# Patient Record
Sex: Male | Born: 1960 | Race: White | Hispanic: No | Marital: Single | Smoking: Current every day smoker
Health system: Southern US, Community
[De-identification: ages and names within clinical notes are randomized; demographics above are authoritative.]

## PROBLEM LIST (undated history)

## (undated) DIAGNOSIS — F419 Anxiety disorder, unspecified: Secondary | ICD-10-CM

## (undated) DIAGNOSIS — K219 Gastro-esophageal reflux disease without esophagitis: Secondary | ICD-10-CM

## (undated) DIAGNOSIS — Z9889 Other specified postprocedural states: Secondary | ICD-10-CM

## (undated) DIAGNOSIS — E785 Hyperlipidemia, unspecified: Secondary | ICD-10-CM

## (undated) DIAGNOSIS — M1990 Unspecified osteoarthritis, unspecified site: Secondary | ICD-10-CM

## (undated) HISTORY — PX: KNEE SURGERY: SHX244

## (undated) HISTORY — DX: Hyperlipidemia, unspecified: E78.5

## (undated) HISTORY — DX: Anxiety disorder, unspecified: F41.9

## (undated) HISTORY — DX: Other specified postprocedural states: Z98.890

## (undated) HISTORY — DX: Gastro-esophageal reflux disease without esophagitis: K21.9

## (undated) HISTORY — DX: Unspecified osteoarthritis, unspecified site: M19.90

---

## 2006-12-11 ENCOUNTER — Emergency Department: Payer: Self-pay | Admitting: Emergency Medicine

## 2007-06-27 ENCOUNTER — Ambulatory Visit: Payer: Self-pay | Admitting: Gastroenterology

## 2009-03-03 ENCOUNTER — Ambulatory Visit: Payer: Self-pay | Admitting: Gastroenterology

## 2009-05-22 ENCOUNTER — Emergency Department: Payer: Self-pay | Admitting: Emergency Medicine

## 2011-05-29 LAB — CBC
HCT: 46.7 % (ref 40.0–52.0)
MCHC: 33.1 g/dL (ref 32.0–36.0)
MCV: 96 fL (ref 80–100)
RBC: 4.87 10*6/uL (ref 4.40–5.90)
RDW: 13 % (ref 11.5–14.5)

## 2011-05-29 LAB — TROPONIN I: Troponin-I: <0.02 ng/mL

## 2011-05-29 LAB — BASIC METABOLIC PANEL
Anion Gap: 11 (ref 7–16)
Chloride: 105 mmol/L (ref 98–107)
Co2: 23 mmol/L (ref 21–32)
Creatinine: 0.99 mg/dL (ref 0.60–1.30)
EGFR (African American): >60
EGFR (Non-African Amer.): >60
Osmolality: 278 (ref 275–301)
Sodium: 139 mmol/L (ref 136–145)

## 2011-05-29 LAB — CK TOTAL AND CKMB (NOT AT ARMC): CK-MB: <0.5 ng/mL — ABNORMAL LOW (ref 0.5–3.6)

## 2011-05-30 ENCOUNTER — Observation Stay: Payer: Self-pay | Admitting: Internal Medicine

## 2011-05-30 DIAGNOSIS — R079 Chest pain, unspecified: Secondary | ICD-10-CM

## 2011-05-30 LAB — CK TOTAL AND CKMB (NOT AT ARMC)
CK, Total: 37 U/L (ref 35–232)
CK-MB: <0.5 ng/mL — ABNORMAL LOW (ref 0.5–3.6)
CK-MB: <0.5 ng/mL — ABNORMAL LOW (ref 0.5–3.6)

## 2011-05-30 LAB — TROPONIN I
Troponin-I: <0.02 ng/mL
Troponin-I: <0.02 ng/mL

## 2011-07-13 ENCOUNTER — Emergency Department: Payer: Self-pay | Admitting: Emergency Medicine

## 2011-07-13 LAB — CBC
HCT: 45.3 % (ref 40.0–52.0)
HGB: 14.7 g/dL (ref 13.0–18.0)
Platelet: 255 10*3/uL (ref 150–440)
RBC: 4.71 10*6/uL (ref 4.40–5.90)

## 2011-07-13 LAB — BASIC METABOLIC PANEL
Anion Gap: 11 (ref 7–16)
BUN: 9 mg/dL (ref 7–18)
Calcium, Total: 9 mg/dL (ref 8.5–10.1)
Chloride: 107 mmol/L (ref 98–107)
EGFR (African American): >60
EGFR (Non-African Amer.): >60
Glucose: 91 mg/dL (ref 65–99)
Osmolality: 281 (ref 275–301)

## 2011-07-13 LAB — CK TOTAL AND CKMB (NOT AT ARMC)
CK, Total: 61 U/L (ref 35–232)
CK-MB: <0.5 ng/mL — ABNORMAL LOW (ref 0.5–3.6)

## 2011-07-13 LAB — TROPONIN I: Troponin-I: <0.02 ng/mL

## 2011-09-19 ENCOUNTER — Ambulatory Visit: Payer: Self-pay

## 2012-03-13 ENCOUNTER — Ambulatory Visit: Payer: Self-pay | Admitting: Gastroenterology

## 2012-04-04 ENCOUNTER — Ambulatory Visit: Payer: Self-pay | Admitting: Internal Medicine

## 2012-06-19 ENCOUNTER — Emergency Department: Payer: Self-pay | Admitting: Emergency Medicine

## 2012-06-20 ENCOUNTER — Ambulatory Visit: Payer: Self-pay | Admitting: Gastroenterology

## 2013-05-23 IMAGING — CT CT ABD-PELV W/ CM
1 of 3 series · 13 of 32 positions shown, 18 images · non-contrast
Comparison: none

REASON FOR EXAM: Abd pain generalized and lower quadrants
COMMENTS:

[Series 2: abd 3mm w 3.0 i40f 3 · axial · 0.92mm/px · z∈[-1178,-770]mm · 13 of 154 slices shown, 18 images]
[im 9/154  soft-tissue]
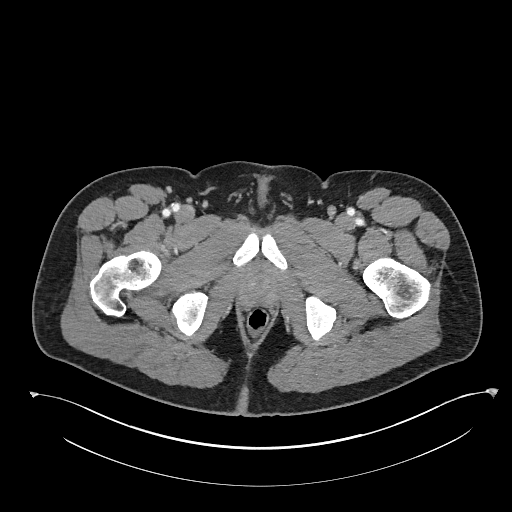
[im 9/154  bone]
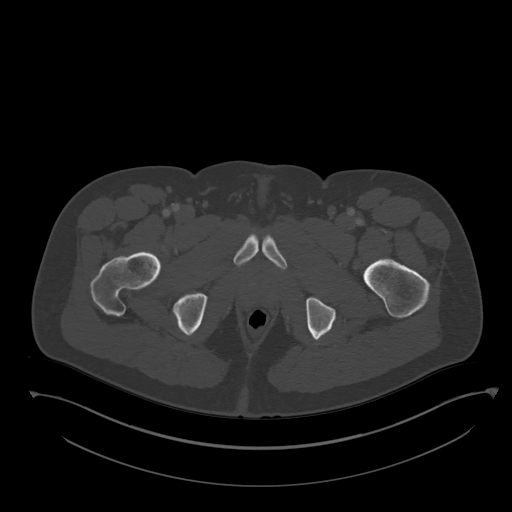
[im 26/154  soft-tissue]
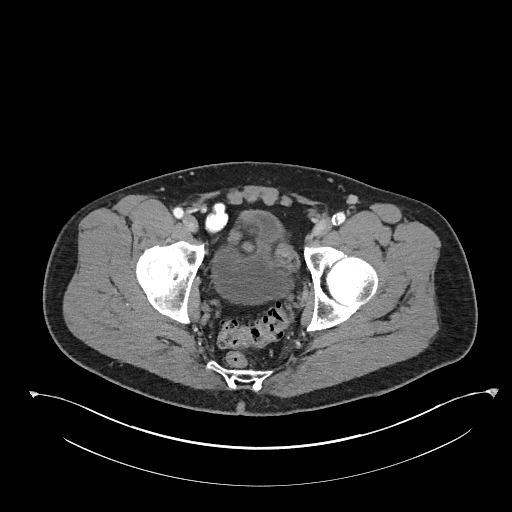
[im 35/154  soft-tissue]
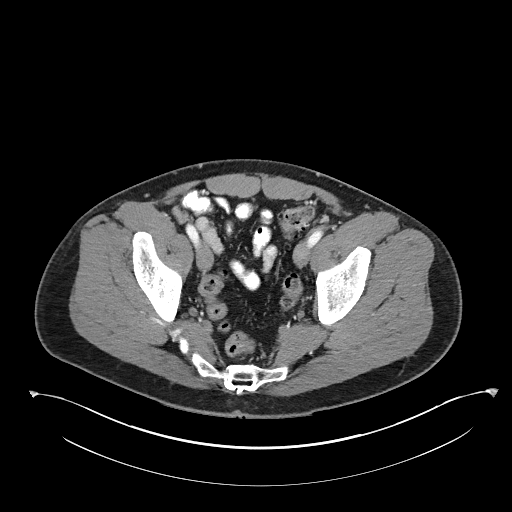
[im 43/154  soft-tissue]
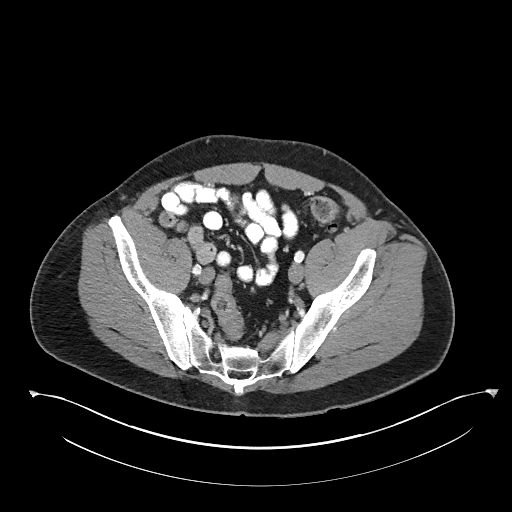
[im 60/154  soft-tissue]
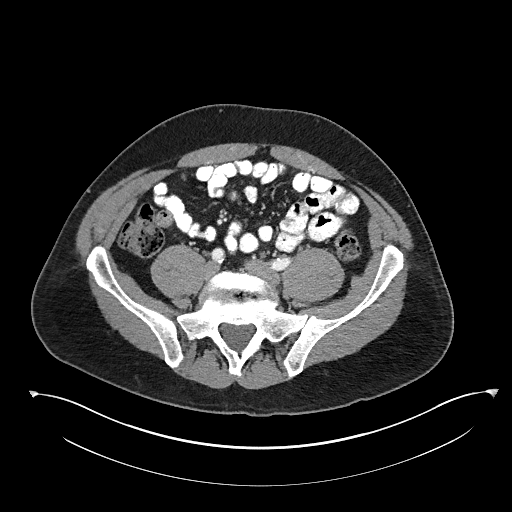
[im 69/154  soft-tissue]
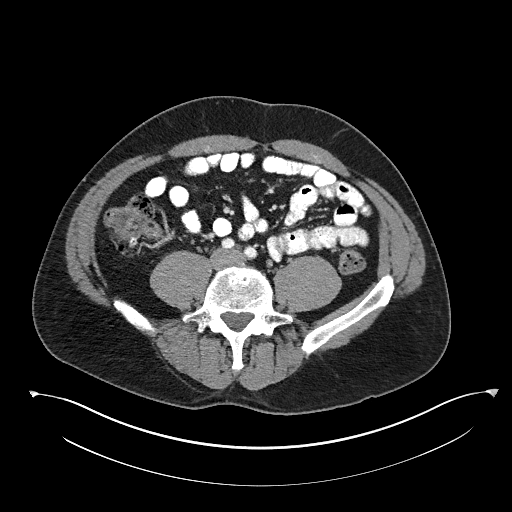
[im 86/154  soft-tissue]
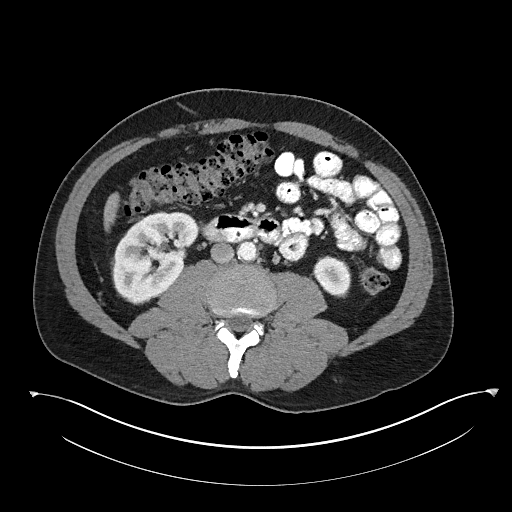
[im 94/154  soft-tissue]
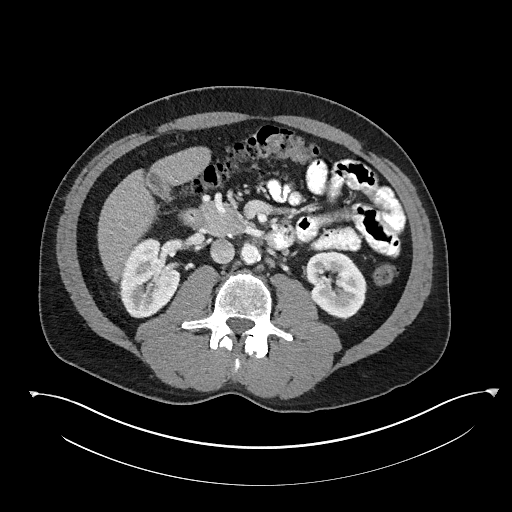
[im 111/154  soft-tissue]
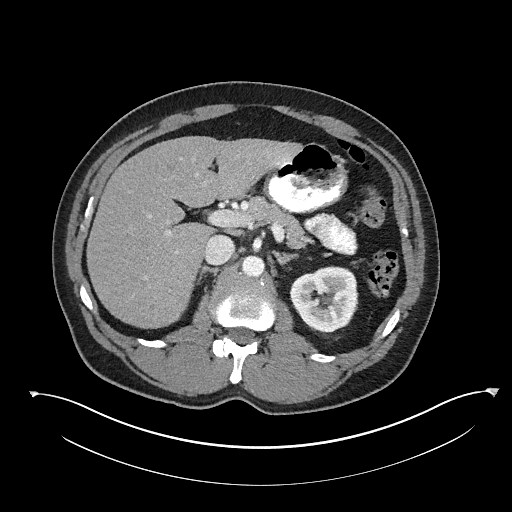
[im 111/154  bone]
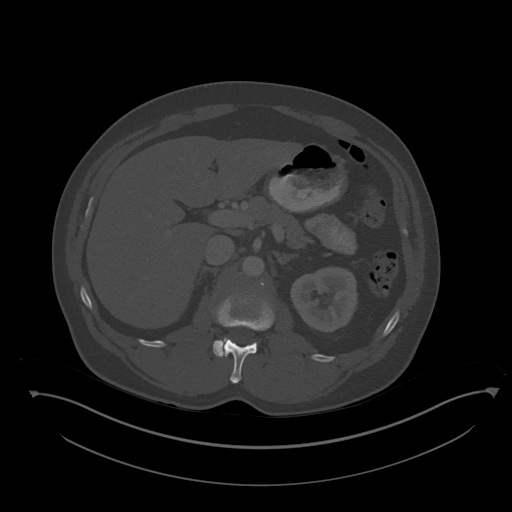
[im 120/154  soft-tissue]
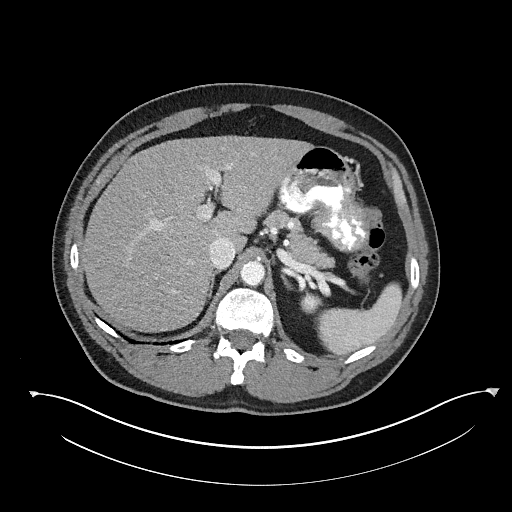
[im 120/154  lung]
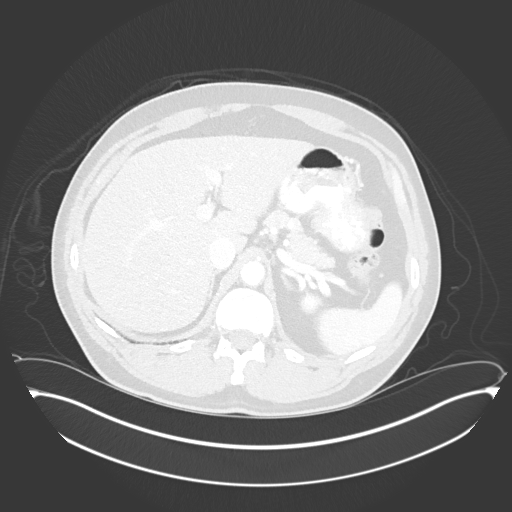
[im 128/154  soft-tissue]
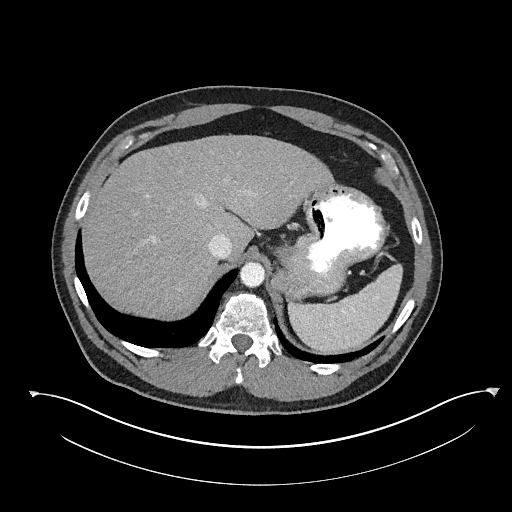
[im 128/154  lung]
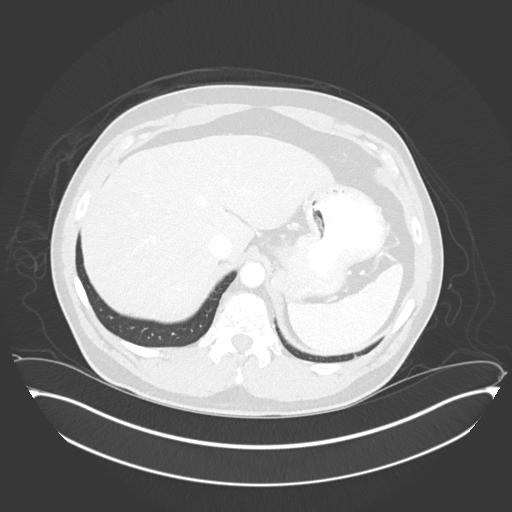
[im 137/154  lung]
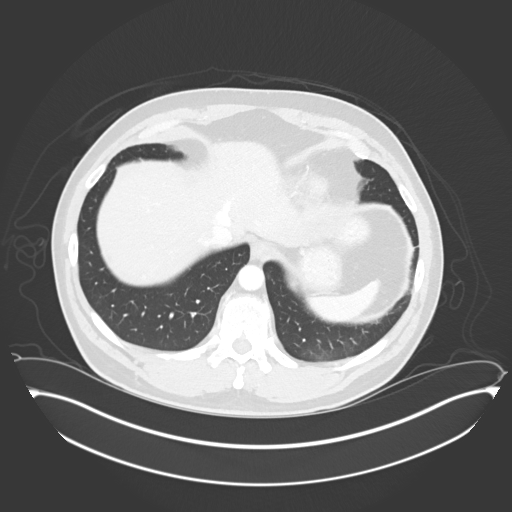
[im 145/154  soft-tissue]
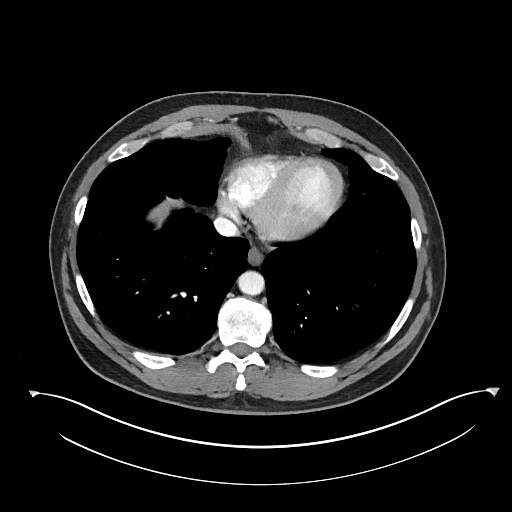
[im 145/154  lung]
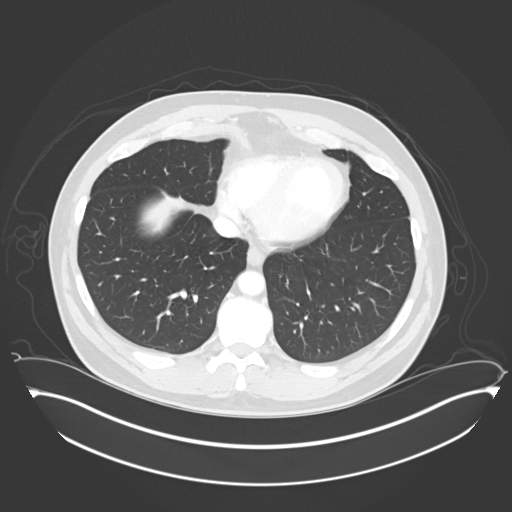

[13 of 32 positions shown; findings below may reference images not displayed]

PROCEDURE:     KCT - KCT ABDOMEN/PELVIS W  - March 13, 2012  [DATE]

RESULT:     CT of the abdomen and pelvis is performed with 100 mL of
2sovue-F55 iodinated intravenous contrast and oral contrast with images
reconstructed at 3 mm slice thickness in the axial plane. Comparison is made
to the previous study dated 27 June, 2007.

Images through the base the lungs demonstrate normal appearing aeration.
There is no pleural or pericardial effusion or pneumothorax. The liver
appears to enhance homogeneously. Punctate calcification in the spleen
suggests previous granulomatous disease. There is a small accessory splenule
on image 39 measuring approximately 12 mm. The adrenal glands appear normal.
The abdominal aorta show some atherosclerotic irregularity and calcification
without aneurysm. There is no evidence of hydronephrosis or nephrolithiasis
demonstrated. No perinephric stranding or abnormal perinephric fluid
collection is evident. The visualized pancreas shows no discrete mass or
ductal dilation. The liver contains a small low-attenuation area in the
right lobe the previously currently measuring approximately 10.7 mm on image
42 and previously measuring 5.2 mm on image 22. The gallbladder is
nondistended and shows no definite stones. There is no abnormal small bowel
distention or wall thickening. The urinary bladder contains a small to
moderate amount of urine without evidence of abnormal distention. No
definite wall thickening is evident. There is no inguinal hernia or evidence
of inguinal mass or adenopathy. The appendix appears normal. There is no
ascites or abnormal fluid collection. There is no inflammatory stranding.
Scattered colonic diverticulosis is present to a mild to moderate degree
predominately in the sigmoid and descending colon regions. The bony
structures appear unremarkable.
IMPRESSION: 1. Small right lobe hepatic cyst slightly larger than the previous study.
Scattered colonic diverticulosis without evidence of acute diverticulitis.
Normal-appearing appendix. No ascites or phlegmonous change. No adenopathy
or acute abnormality. Scattered atherosclerotic disease. The lung bases are
clear.

[REDACTED]

## 2013-08-03 ENCOUNTER — Emergency Department: Payer: Self-pay | Admitting: Emergency Medicine

## 2013-08-03 LAB — CBC
HCT: 48.6 % (ref 40.0–52.0)
HGB: 16.4 g/dL (ref 13.0–18.0)
MCH: 32 pg (ref 26.0–34.0)
MCHC: 33.6 g/dL (ref 32.0–36.0)
MCV: 95 fL (ref 80–100)
Platelet: 253 10*3/uL (ref 150–440)
RBC: 5.1 10*6/uL (ref 4.40–5.90)
RDW: 13.4 % (ref 11.5–14.5)
WBC: 12.4 10*3/uL — ABNORMAL HIGH (ref 3.8–10.6)

## 2013-08-03 LAB — BASIC METABOLIC PANEL
ANION GAP: 8 (ref 7–16)
BUN: 11 mg/dL (ref 7–18)
CHLORIDE: 107 mmol/L (ref 98–107)
Calcium, Total: 8.9 mg/dL (ref 8.5–10.1)
Co2: 23 mmol/L (ref 21–32)
Creatinine: 0.99 mg/dL (ref 0.60–1.30)
Glucose: 101 mg/dL — ABNORMAL HIGH (ref 65–99)
OSMOLALITY: 275 (ref 275–301)
Potassium: 4.6 mmol/L (ref 3.5–5.1)
SODIUM: 138 mmol/L (ref 136–145)

## 2013-08-03 LAB — TROPONIN I: Troponin-I: <0.02 ng/mL

## 2013-12-28 ENCOUNTER — Emergency Department: Payer: Self-pay | Admitting: Emergency Medicine

## 2014-04-25 NOTE — Discharge Summary (Signed)
PATIENT NAME:  [name], Steven MR#:  [id number] DATE OF BIRTH:  04-04-1960  DATE OF ADMISSION:  05/30/2011 DATE OF DISCHARGE:  05/30/2011  ADMISSION DIAGNOSIS: Chest pain.   DISCHARGE DIAGNOSES:  1. Chest pain, not cardiac in nature.  2. Elevated blood pressure, no diagnosis of hypertension.  3. Tobacco abuse.   CONSULTATIONS: None.   LABORATORY, DIAGNOSTIC, AND RADIOLOGICAL DATA: CT of the chest showed no pulmonary emboli.   Nuclear Medicine stress test showed no evidence of ischemia.  Troponins x2 were negative.   HOSPITAL COURSE: The patient is a 54 year old male who presented with chest pain. For further details, please refer to the history and physical.   1. Chest pain. The patient had a normal D-dimer. CT chest was performed to rule out PE which was negative. He went for a Myoview which also was negative.  2. Elevated blood pressure, no diagnosis of hypertension. The patient will be discharged on metoprolol and the patient will have outpatient follow-up for this.  3. Tobacco abuse. The patient was counseled and was discharged with a nicotine patch.   DISCHARGE MEDICATIONS:  1. Aspirin 81 mg daily. 2. Metoprolol 25 b.i.d.  3. Nicotine patch 21 mg per 24 hours.   DISCHARGE DIET: Low sodium.   DISCHARGE ACTIVITY: As tolerated.   DISCHARGE FOLLOW-UP: The patient will need to find a primary care physician in the community for follow-up.   DISPOSITION: The patient was medically stable for discharge.   TIME SPENT: 35 minutes.  ____________________________ Janyth ContesSital P. Juliene PinaMody, MD spm:drc D: 05/31/2011 13:18:41 ET T: 06/01/2011 09:31:12 ET JOB#: [id number]  cc: Mehek Grega P. Juliene PinaMody, MD, <Dictator> Janyth ContesSITAL P Yohann Curl MD ELECTRONICALLY SIGNED 06/08/2011 12:40

## 2014-04-25 NOTE — H&P (Signed)
PATIENT NAME:  Steven Escobar, SECKMAN MR#:  [id number] DATE OF BIRTH:  [birth year]  DATE OF ADMISSION:  05/30/2011  PRIMARY CARE PHYSICIAN: Unassigned.   SUBJECTIVE: This is a 54 year old male who presents to the ER with a chief complaint of chest pain which he has been experiencing for the last one month. The chest pain is mainly pleuritic without any aggravating or relieving factors. He does not notice a noticeable change with exertion. It is associated with shortness of breath, made worse when the patient lays flat in bed. The patient continues to smoke a pack a day. He, however, denies any ongoing drug use. The patient has not had any fever, chills, rigors or cough. He denies any syncopal or near syncopal episodes. He has not had any previous cardiac work-up. He states that last year a homeopathic physician diagnosed him with pancreatic cancer; however, this was not based on any imaging studies, and he has not had a follow-up for this since.   PAST MEDICAL HISTORY:  1. Nicotine dependence.  2. Chronic back pain.   PAST SURGICAL HISTORY: None.   SOCIAL HISTORY: The patient is unemployed. He has been unemployed for the last two years. 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 He states that he quit drinking three years ago and is clean and has no history of drug use.   FAMILY HISTORY: Positive for colon cancer in the father. Otherwise, negative for heart disease, hypertension, and diabetes.   ALLERGIES: No known drug allergies.   HOME MEDICATIONS: None.   REVIEW OF SYSTEMS: CONSTITUTIONAL: Denies any fever, fatigue, weakness, pain, weight loss or weight gain. EYES: Denies any blurry vision or double vision, inflammation, glaucoma or cataracts. ENT: Denies any tinnitus, ear pain, hearing loss, seasonal allergies, epistaxis. RESPIRATORY: Denies any cough, wheezing, hemoptysis or dyspnea. CARDIOVASCULAR: Denies any edema but does have chest pain and some orthopnea and  dyspnea on exertion. GASTROINTESTINAL: Denies any nausea, vomiting, abdominal pain, hematemesis, melena. GENITOURINARY: Denies any dysuria, hematuria, renal calculi, or frequency. ENDOCRINE: Denies any polyuria, nocturia, thyroid problems, increased swelling. HEMATOLOGIC:  Denies any easy bruising, bleeding, swollen glands. SKIN: Denies any rashes, changes in moles, hair or skin. MUSCULOSKELETAL: Complaining of low back pain which is chronic for him, otherwise denies any joint involvement, any redness or any limitation of activity. NEUROLOGIC: Denies any numbness, weakness, dysarthria, epilepsy. PSYCHIATRIC: Appears to be mildly anxious, otherwise appropriate.   PHYSICAL EXAMINATION:  VITAL SIGNS: Blood pressure 133/90, pulse 95, temperature 97.   GENERAL: Appears to be comfortable currently, no acute cardiopulmonary distress.   HEENT: Pupils are equal and reactive to light. Extraocular movements are intact.   NECK: Supple without any JVD. No thyroid enlargement, no meningismus.   LUNGS: Clear to auscultation bilaterally. No wheezes or crackles, rhonchi. No accessory muscle use.   CARDIOVASCULAR: Regular rate and rhythm. No murmurs, rubs, or gallops.   ABDOMEN: Soft, nontender, nondistended. Bowel sounds are normoactive.   NEUROLOGIC: Cranial nerves II through XII are grossly intact. Motor strength is intact in bilateral upper and lower extremities. No cerebellar deficits. Gait not tested.   PSYCHIATRIC: Appropriate mood and affect.   LYMPHATIC: No axillary, inguinal, cervical lymphadenopathy.   SKIN: Without any skin rashes, redness.   PSYCHIATRIC: Alert and oriented x3, appropriate mood and affect.     LABORATORY, DIAGNOSTIC AND RADIOLOGICAL DATA:  WBC 9.3, hemoglobin 15.5, hematocrit 46.7, platelet count 265. Glucose 113, BUN 11, creatinine 0.99, sodium 139, potassium 3.9,  chloride 105, bicarbonate 23, serum calcium 8.7, anion gap 11.  Troponin less than 0.02. CK-MB less than 0.5.    ASSESSMENT AND PLAN:  1. Chest pain with pleuritic features as well as features of unstable angina: At this point, the patient and has no documented clear past medical history, which also includes questionable history of pancreatic cancer. A D-dimer will be obtained; if elevated, a CT angiogram of the chest will be done to rule out a pulmonary embolism. We will start him on a therapeutic dose of Lovenox as well as aspirin, beta blocker and nitroglycerin. We will cycle cardiac enzymes. A Lexiscan Myoview will be scheduled for in the morning. He will be kept n.p.o. for this.  2. Nicotine dependence: At this time, hold off on the nicotine patch. This may be restarted in the morning if his two sets of cardiac enzymes are negative.  3. Family history of colon cancer: The patient had a colonoscopy with removal of polyp fairly recently and has a history of diverticulosis which has been asymptomatic.  4. Questionable history of pancreatic cancer: We will order an abdominal ultrasound to ensure that there are no gross abnormal findings. We will also add a lipase and liver function tests.   CODE STATUS:  He is a FULL CODE.     ____________________________ Richarda OverlieNayana Wylene Weissman, MD na:cbb D: 05/30/2011 00:32:39 ET T: 05/30/2011 07:18:55 ET JOB#: [id number]  cc: Richarda OverlieNayana Kamla Skilton, MD, <Dictator> Richarda OverlieNAYANA Brailyn Delman MD ELECTRONICALLY SIGNED 06/13/2011 0:13

## 2016-03-30 ENCOUNTER — Telehealth: Payer: Self-pay | Admitting: Pharmacist

## 2016-03-30 NOTE — Telephone Encounter (Signed)
03/09/16 Received a call from Tabitha @ [facility] stating Dr. Ricki Rodriguez Declined to sign form for Dexilant, patient has canceled last 2 appointment 08/23/15 & 10/04/15. I have picked up the forms from their office with a note on this documentation, filing in patient chart. I will close this claim and send a note to Valinda and also copy to [facility].

## 2016-05-10 ENCOUNTER — Telehealth: Payer: Self-pay | Admitting: Pharmacy Technician

## 2016-05-10 NOTE — Telephone Encounter (Signed)
Spoke with patient.  Patient stated that he does not funds to see Dr. Jacqulyn Liner at [facility] or Dr. Clayborn Bigness.  Referring patient to [facility].    Patient approved to receive medication assistance at [facility] through 2018, as long as eligibility continues to be met.  [facility]

## 2016-06-18 ENCOUNTER — Telehealth: Payer: Self-pay | Admitting: Pharmacist

## 2016-06-18 NOTE — Telephone Encounter (Signed)
06/18/16 Faxed Re Enrollment application to [employer] for Dexilant 60mg  Take one tablet daily.

## 2016-08-02 DIAGNOSIS — M1710 Unilateral primary osteoarthritis, unspecified knee: Secondary | ICD-10-CM | POA: Insufficient documentation

## 2016-08-02 DIAGNOSIS — M179 Osteoarthritis of knee, unspecified: Secondary | ICD-10-CM | POA: Insufficient documentation

## 2016-08-23 ENCOUNTER — Telehealth: Payer: Self-pay | Admitting: Pharmacist

## 2016-08-23 NOTE — Telephone Encounter (Signed)
08/23/16 Called [employer] for refill on Dexilant 60mg .Forde Radon

## 2016-11-02 ENCOUNTER — Telehealth: Payer: Self-pay | Admitting: Pharmacist

## 2016-11-02 NOTE — Telephone Encounter (Signed)
11/02/16 Called [employer] - placed refill for Dexilant 60mg .Forde RadonAJ

## 2017-01-30 ENCOUNTER — Telehealth: Payer: Self-pay | Admitting: Pharmacist

## 2017-01-30 NOTE — Telephone Encounter (Signed)
01/30/17 Called [employer] for refill on Dexilant 60, allow 7-10 business days to receive.Steven RadonAJ

## 2017-02-13 ENCOUNTER — Telehealth: Payer: Self-pay | Admitting: Pharmacy Technician

## 2017-02-13 NOTE — Telephone Encounter (Signed)
Patient eligible to receive medication assistance at [facility] through 2019, as long as eligibility requirements continue to be met.  [facility]

## 2017-03-19 ENCOUNTER — Telehealth: Payer: Self-pay | Admitting: Pharmacist

## 2017-03-19 NOTE — Telephone Encounter (Signed)
03/19/2017 2:32:34 PM - Dexilant  03/19/17 Called [employer] to check on status of refill for Dexilant 60mg  that I called a refill 01/30/17 and we have not received. Spoke with Alcario Droughtrica she stated that patient's enrollment ended 12/31/16, they will need a new application before further shipments. I have printed a new application--mailing patient his portion to sign & return, will take provider her portion to sign.Forde RadonAJ

## 2017-04-01 ENCOUNTER — Ambulatory Visit: Payer: Self-pay | Admitting: Physician Assistant

## 2017-04-01 ENCOUNTER — Encounter: Payer: Self-pay | Admitting: Physician Assistant

## 2017-04-01 ENCOUNTER — Other Ambulatory Visit: Payer: Self-pay

## 2017-04-01 VITALS — BP 142/100 | HR 82 | Temp 97.8°F | Resp 16 | Ht 74.0 in | Wt 263.0 lb

## 2017-04-01 DIAGNOSIS — Z7689 Persons encountering health services in other specified circumstances: Secondary | ICD-10-CM

## 2017-04-01 DIAGNOSIS — K2100 Gastro-esophageal reflux disease with esophagitis, without bleeding: Secondary | ICD-10-CM

## 2017-04-01 DIAGNOSIS — F17200 Nicotine dependence, unspecified, uncomplicated: Secondary | ICD-10-CM

## 2017-04-01 DIAGNOSIS — E755 Other lipid storage disorders: Secondary | ICD-10-CM

## 2017-04-01 DIAGNOSIS — I10 Essential (primary) hypertension: Secondary | ICD-10-CM | POA: Insufficient documentation

## 2017-04-01 DIAGNOSIS — M1711 Unilateral primary osteoarthritis, right knee: Secondary | ICD-10-CM

## 2017-04-01 DIAGNOSIS — F1011 Alcohol abuse, in remission: Secondary | ICD-10-CM | POA: Insufficient documentation

## 2017-04-01 DIAGNOSIS — E782 Mixed hyperlipidemia: Secondary | ICD-10-CM | POA: Insufficient documentation

## 2017-04-01 DIAGNOSIS — Z87898 Personal history of other specified conditions: Secondary | ICD-10-CM

## 2017-04-01 DIAGNOSIS — K219 Gastro-esophageal reflux disease without esophagitis: Secondary | ICD-10-CM | POA: Insufficient documentation

## 2017-04-01 NOTE — Patient Instructions (Signed)

## 2017-04-01 NOTE — Progress Notes (Signed)
Patient: Steven Escobar, Male    DOB: Sep 01, 1960, 57 y.o.   MRN: [id number] Visit Date: 04/01/2017  Today's Provider: Margaretann Loveless, PA-C   Chief Complaint  Patient presents with  . New Patient (Initial Visit)   Subjective:   Patient here today to establish care, patient reports he has not had a PCP in several years. Patient requesting lab checked.   Steven Escobar is a 57 y.o. male who presents today for establishing care. He feels well. He reports exercising none. He reports he is sleeping well.  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 He was told it was from cholesterol and wants his cholesterol levels checked.   He does have HTN and is followed by Dr. Juliann Pares. He is currently on Metoprolol 50mg  and Lisinopril 10mg . He also has reflux esophagitis and is well controlled on Dexilant 60mg . He is to have his colonoscopy this year if he can afford it. He is scheduled for f/u with GI on 06/04/17.   He is currently unemployed due to severe OA in his right knee, followed by [facility] ortho.  -----------------------------------------------------------------   Review of Systems  Constitutional: Negative.   HENT: Negative.   Eyes: Negative.   Respiratory: Negative.   Cardiovascular: Negative.   Gastrointestinal: Negative.   Endocrine: Negative.   Genitourinary: Negative.   Musculoskeletal: Positive for arthralgias (knee).  Skin: Negative.   Allergic/Immunologic: Negative.   Neurological: Negative.   Hematological: Negative.   Psychiatric/Behavioral: The patient is nervous/anxious.     Social History      He  reports that he has been smoking cigarettes.  He has a 20.00 pack-year smoking history. He has never used smokeless tobacco. He reports that he drinks about 1.8 oz of alcohol per week. He reports that he does not use drugs.       Social History   Socioeconomic History  . Marital status: Single    Spouse name:  Not on file  . Number of children: 1  . Years of education: Not on file  . Highest education level: Not on file  Occupational History  . Not on file  Social Needs  . Financial resource strain: Somewhat hard  . Food insecurity:    Worry: Sometimes true    Inability: Not on file  . Transportation needs:    Medical: No    Non-medical: No  Tobacco Use  . Smoking status: Current Every Day Smoker    Packs/day: 0.50    Years: 40.00    Pack years: 20.00    Types: Cigarettes  . Smokeless tobacco: Never Used  Substance and Sexual Activity  . Alcohol use: Yes    Alcohol/week: 1.8 oz    Types: 3 Cans of beer per week  . Drug use: Never  . Sexual activity: Not on file  Lifestyle  . Physical activity:    Days per week: 0 days    Minutes per session: 0 min  . Stress: Not on file  Relationships  . Social connections:    Talks on phone: Not on file    Gets together: Not on file    Attends religious service: Not on file    Active member of club or organization: Not on file    Attends meetings of clubs or organizations: Not on file    Relationship status: Not on file  Other Topics Concern  . Not on file  Social History Narrative  .  Not on file    Past Medical History:  Diagnosis Date  . Anxiety   . Arthritis   . GERD (gastroesophageal reflux disease)   . H/O knee surgery    bilateral   . Hyperlipidemia      Patient Active Problem List   Diagnosis Date Noted  . GERD (gastroesophageal reflux disease) 04/01/2017  . Hypertension 04/01/2017  . Osteoarthritis of knee 08/02/2016    Past Surgical History:  Procedure Laterality Date  . KNEE SURGERY Bilateral     Family History        Family Status  Relation Name Status  . Father  Deceased  . Mother  Alive  . Sister  Alive  . Brother  Alive  . Daughter  Alive  . Sister  Alive        His family history includes Colon cancer in his father; Melanoma in his sister.      No Known Allergies   Current Outpatient  Medications:  .  dexlansoprazole (DEXILANT) 60 MG capsule, Take 60 mg by mouth daily. , Disp: , Rfl:  .  lisinopril (PRINIVIL,ZESTRIL) 10 MG tablet, TAKE ONE TABLET BY MOUTH EVERY DAY, Disp: , Rfl:  .  metoprolol tartrate (LOPRESSOR) 50 MG tablet, TAKE ONE TABLET BY MOUTH 2 TIMES A DAY, Disp: , Rfl:    Patient Care Team: Margaretann Loveless, PA-C as PCP - General (Family Medicine)      Objective:   Vitals: BP (!) 142/100 (BP Location: Left Arm, Patient Position: Sitting, Cuff Size: Large)   Pulse 82   Temp 97.8 F (36.6 C) (Oral)   Resp 16   Ht 6\' 2"  (1.88 m)   Wt 263 lb (119.3 kg)   SpO2 97%   BMI 33.77 kg/m    Vitals:   04/01/17 1019  BP: (!) 142/100  Pulse: 82  Resp: 16  Temp: 97.8 F (36.6 C)  TempSrc: Oral  SpO2: 97%  Weight: 263 lb (119.3 kg)  Height: 6\' 2"  (1.88 m)     Physical Exam  Constitutional: He is oriented to person, place, and time. He appears well-developed and well-nourished.  HENT:  Head: Normocephalic and atraumatic.  Right Ear: Hearing, tympanic membrane, external ear and ear canal normal.  Left Ear: Hearing, tympanic membrane, external ear and ear canal normal.  Nose: Nose normal.  Mouth/Throat: Uvula is midline, oropharynx is clear and moist and mucous membranes are normal.  Eyes: Pupils are equal, round, and reactive to light. Conjunctivae and EOM are normal.    Neck: Normal range of motion. Neck supple. Carotid bruit is not present. No tracheal deviation present. No thyromegaly present.  Cardiovascular: Normal rate, regular rhythm, normal heart sounds and intact distal pulses.  No murmur heard. Pulmonary/Chest: Effort normal and breath sounds normal. No respiratory distress. He has no wheezes. He has no rales. He exhibits no tenderness.  Abdominal: Soft. He exhibits no distension and no mass. There is no tenderness. There is no rebound and no guarding.  Musculoskeletal: Normal range of motion. He exhibits no edema or tenderness.    Lymphadenopathy:    He has no cervical adenopathy.  Neurological: He is alert and oriented to person, place, and time. He has normal reflexes. No cranial nerve deficit. He exhibits normal muscle tone. Coordination normal.  Skin: Skin is warm and dry. No rash noted. No erythema.  Psychiatric: He has a normal mood and affect. His behavior is normal. Judgment and thought content normal.  Vitals reviewed.  Depression Screen PHQ 2/9 Scores 04/01/2017  PHQ - 2 Score 3  PHQ- 9 Score 4      Assessment & Plan:     Routine Health Maintenance and Physical Exam  Exercise Activities and Dietary recommendations Goals    None       There is no immunization history on file for this patient.  Health Maintenance  Topic Date Due  . Hepatitis C Screening  1960/02/24  . HIV Screening  05/25/1975  . TETANUS/TDAP  05/25/1979  . COLONOSCOPY  05/25/2010  . INFLUENZA VACCINE  08/01/2017     Discussed health benefits of physical activity, and encouraged him to engage in regular exercise appropriate for his age and condition.    1. Encounter to establish care No previous PCP.   2. Essential hypertension Uncontrolled today but patient reports not taking his medications this morning, stating he is out and plans to pick up his Rx from medication management. I will check labs as below for the xanthoma noted on the left eyelid. I will f/u pending results of the labs.  - Lipid Panel With LDL/HDL Ratio - Comprehensive metabolic panel  3. Xanthoma See above medical treatment plan. - Lipid Panel With LDL/HDL Ratio  4. Gastroesophageal reflux disease with esophagitis Stable on Dexilant and followed by [facility].   5. Primary osteoarthritis of right knee Recently had steroid injection that did not offer much relief. Unable to afford Synvisc injections or any surgical procedures at this time. [facility] if [facility] may assist for him to get surgery.   6.  History of alcohol abuse Reports no longer drinking.  7. Compulsive tobacco user syndrome Wants to quit smoking but unable to afford medications or patches to help him quit.   --------------------------------------------------------------------    Margaretann LovelessJennifer M Burnette, PA-C  [facility]

## 2017-04-02 ENCOUNTER — Telehealth: Payer: Self-pay | Admitting: Pharmacist

## 2017-04-02 NOTE — Telephone Encounter (Signed)
04/02/2017 9:43:52 AM - Dexilant renewal to [employer]  04/02/17 Faxed [employer] a renewal application for Dexilant 60mg  Take one capsule daily.Forde RadonAJ

## 2017-04-03 ENCOUNTER — Other Ambulatory Visit: Payer: Self-pay

## 2017-04-03 LAB — COMPREHENSIVE METABOLIC PANEL
A/G RATIO: 1.4 (ref 1.2–2.2)
ALBUMIN: 4.2 g/dL (ref 3.5–5.5)
ALT: 46 IU/L — ABNORMAL HIGH (ref 0–44)
AST: 19 IU/L (ref 0–40)
Alkaline Phosphatase: 63 IU/L (ref 39–117)
BILIRUBIN TOTAL: 0.4 mg/dL (ref 0.0–1.2)
BUN/Creatinine Ratio: 11 (ref 9–20)
BUN: 12 mg/dL (ref 6–24)
CHLORIDE: 101 mmol/L (ref 96–106)
CO2: 25 mmol/L (ref 20–29)
Calcium: 10 mg/dL (ref 8.7–10.2)
Creatinine, Ser: 1.12 mg/dL (ref 0.76–1.27)
GFR calc non Af Amer: 73 mL/min/{1.73_m2} (ref >59)
GFR, EST AFRICAN AMERICAN: 84 mL/min/{1.73_m2} (ref >59)
Globulin, Total: 3.1 g/dL (ref 1.5–4.5)
Glucose: 98 mg/dL (ref 65–99)
POTASSIUM: 5.9 mmol/L — AB (ref 3.5–5.2)
Sodium: 141 mmol/L (ref 134–144)
TOTAL PROTEIN: 7.3 g/dL (ref 6.0–8.5)

## 2017-04-03 LAB — LIPID PANEL WITH LDL/HDL RATIO
Cholesterol, Total: 227 mg/dL — ABNORMAL HIGH (ref 100–199)
HDL: 38 mg/dL — AB (ref >39)
LDL Calculated: 148 mg/dL — ABNORMAL HIGH (ref 0–99)
LDl/HDL Ratio: 3.9 ratio — ABNORMAL HIGH (ref 0.0–3.6)
TRIGLYCERIDES: 206 mg/dL — AB (ref 0–149)
VLDL Cholesterol Cal: 41 mg/dL — ABNORMAL HIGH (ref 5–40)

## 2017-04-03 MED ORDER — ATORVASTATIN CALCIUM 20 MG PO TABS: 20.0000 mg | ORAL_TABLET | Freq: Every day | ORAL | 3 refills | Status: DC

## 2017-04-03 NOTE — Telephone Encounter (Signed)
Patient advised as below. Patient verbalizes understanding and is in agreement with treatment plan.  

## 2017-04-03 NOTE — Telephone Encounter (Signed)
-----   Message from Margaretann LovelessJennifer M Burnette, PA-C sent at 04/03/2017  8:36 AM EDT ----- Cholesterol is elevated. I would recommend starting a medication to lower cholesterol. This is generic and should not be expensive. Just let me know which pharmacy. Kidney and liver function are normal. Sugar is normal. Potassium is elevated. If taking any potassium supplements please discontinue. Sodium, chloride are normal.

## 2017-07-08 DIAGNOSIS — Z1211 Encounter for screening for malignant neoplasm of colon: Secondary | ICD-10-CM | POA: Diagnosis not present

## 2017-07-08 DIAGNOSIS — I10 Essential (primary) hypertension: Secondary | ICD-10-CM | POA: Diagnosis not present

## 2017-07-08 DIAGNOSIS — G8929 Other chronic pain: Secondary | ICD-10-CM | POA: Diagnosis not present

## 2017-07-08 DIAGNOSIS — M25561 Pain in right knee: Secondary | ICD-10-CM | POA: Diagnosis not present

## 2017-07-08 DIAGNOSIS — Z1159 Encounter for screening for other viral diseases: Secondary | ICD-10-CM | POA: Diagnosis not present

## 2017-07-12 DIAGNOSIS — Z23 Encounter for immunization: Secondary | ICD-10-CM | POA: Diagnosis not present

## 2017-07-12 DIAGNOSIS — I10 Essential (primary) hypertension: Secondary | ICD-10-CM | POA: Diagnosis not present

## 2017-07-12 DIAGNOSIS — Z01818 Encounter for other preprocedural examination: Secondary | ICD-10-CM | POA: Diagnosis not present

## 2017-08-07 DIAGNOSIS — D122 Benign neoplasm of ascending colon: Secondary | ICD-10-CM | POA: Diagnosis not present

## 2017-08-07 DIAGNOSIS — K5730 Diverticulosis of large intestine without perforation or abscess without bleeding: Secondary | ICD-10-CM | POA: Diagnosis not present

## 2017-08-07 DIAGNOSIS — K644 Residual hemorrhoidal skin tags: Secondary | ICD-10-CM | POA: Diagnosis not present

## 2017-08-07 DIAGNOSIS — D125 Benign neoplasm of sigmoid colon: Secondary | ICD-10-CM | POA: Diagnosis not present

## 2017-08-07 DIAGNOSIS — Z86010 Personal history of colonic polyps: Secondary | ICD-10-CM | POA: Diagnosis not present

## 2017-08-09 DIAGNOSIS — D369 Benign neoplasm, unspecified site: Secondary | ICD-10-CM | POA: Diagnosis not present

## 2017-08-20 DIAGNOSIS — S83241A Other tear of medial meniscus, current injury, right knee, initial encounter: Secondary | ICD-10-CM | POA: Diagnosis not present

## 2017-10-03 DIAGNOSIS — E875 Hyperkalemia: Secondary | ICD-10-CM | POA: Diagnosis not present

## 2017-10-03 DIAGNOSIS — Z131 Encounter for screening for diabetes mellitus: Secondary | ICD-10-CM | POA: Diagnosis not present

## 2017-10-03 DIAGNOSIS — E785 Hyperlipidemia, unspecified: Secondary | ICD-10-CM | POA: Diagnosis not present

## 2017-10-03 DIAGNOSIS — I10 Essential (primary) hypertension: Secondary | ICD-10-CM | POA: Diagnosis not present

## 2018-01-09 DIAGNOSIS — M25561 Pain in right knee: Secondary | ICD-10-CM | POA: Diagnosis not present

## 2018-01-27 DIAGNOSIS — E785 Hyperlipidemia, unspecified: Secondary | ICD-10-CM | POA: Diagnosis not present

## 2018-01-27 DIAGNOSIS — E875 Hyperkalemia: Secondary | ICD-10-CM | POA: Diagnosis not present

## 2018-01-27 DIAGNOSIS — S83241D Other tear of medial meniscus, current injury, right knee, subsequent encounter: Secondary | ICD-10-CM | POA: Diagnosis not present

## 2018-01-27 DIAGNOSIS — I10 Essential (primary) hypertension: Secondary | ICD-10-CM | POA: Diagnosis not present

## 2018-01-28 DIAGNOSIS — M25861 Other specified joint disorders, right knee: Secondary | ICD-10-CM | POA: Diagnosis not present

## 2018-01-28 DIAGNOSIS — Z9889 Other specified postprocedural states: Secondary | ICD-10-CM | POA: Diagnosis not present

## 2018-01-28 DIAGNOSIS — S83281D Other tear of lateral meniscus, current injury, right knee, subsequent encounter: Secondary | ICD-10-CM | POA: Diagnosis not present

## 2018-01-28 DIAGNOSIS — M949 Disorder of cartilage, unspecified: Secondary | ICD-10-CM | POA: Diagnosis not present

## 2018-02-10 DIAGNOSIS — M25561 Pain in right knee: Secondary | ICD-10-CM | POA: Diagnosis not present

## 2018-02-26 DIAGNOSIS — G8929 Other chronic pain: Secondary | ICD-10-CM | POA: Diagnosis not present

## 2018-02-26 DIAGNOSIS — M1711 Unilateral primary osteoarthritis, right knee: Secondary | ICD-10-CM | POA: Diagnosis not present

## 2018-03-11 DIAGNOSIS — L821 Other seborrheic keratosis: Secondary | ICD-10-CM | POA: Diagnosis not present

## 2018-03-11 DIAGNOSIS — Z1283 Encounter for screening for malignant neoplasm of skin: Secondary | ICD-10-CM | POA: Diagnosis not present

## 2018-03-11 DIAGNOSIS — H0260 Xanthelasma of unspecified eye, unspecified eyelid: Secondary | ICD-10-CM | POA: Diagnosis not present

## 2018-08-13 ENCOUNTER — Telehealth: Payer: Self-pay | Admitting: Pharmacy Technician

## 2018-08-13 NOTE — Telephone Encounter (Signed)
Patient failed to provide2020 financial documentation. No additional medication assistance will be provided by [facility] without the required proof of income documentation. Patient notified by letter.  Shanna Strength, CPhT [facility] 

## 2018-10-30 ENCOUNTER — Other Ambulatory Visit: Payer: Self-pay

## 2018-10-30 ENCOUNTER — Emergency Department
Admission: EM | Admit: 2018-10-30 | Discharge: 2018-10-30 | Disposition: A | Discharge status: Home / Self Care | Payer: Medicaid Other | Attending: Emergency Medicine | Admitting: Emergency Medicine

## 2018-10-30 ENCOUNTER — Emergency Department: Payer: Medicaid Other

## 2018-10-30 ENCOUNTER — Encounter: Payer: Self-pay | Admitting: Emergency Medicine

## 2018-10-30 DIAGNOSIS — I10 Essential (primary) hypertension: Secondary | ICD-10-CM | POA: Insufficient documentation

## 2018-10-30 DIAGNOSIS — F17210 Nicotine dependence, cigarettes, uncomplicated: Secondary | ICD-10-CM | POA: Diagnosis not present

## 2018-10-30 DIAGNOSIS — M545 Low back pain, unspecified: Secondary | ICD-10-CM

## 2018-10-30 DIAGNOSIS — Z79899 Other long term (current) drug therapy: Secondary | ICD-10-CM | POA: Insufficient documentation

## 2018-10-30 DIAGNOSIS — W19XXXA Unspecified fall, initial encounter: Secondary | ICD-10-CM

## 2018-10-30 DIAGNOSIS — W1789XA Other fall from one level to another, initial encounter: Secondary | ICD-10-CM | POA: Insufficient documentation

## 2018-10-30 MED ORDER — OXYCODONE HCL 5 MG PO TABS: 5.0000 mg | ORAL_TABLET | Freq: Four times a day (QID) | ORAL | 0 refills | Status: DC | PRN

## 2018-10-30 MED ORDER — METHOCARBAMOL 500 MG PO TABS: 500.0000 mg | ORAL_TABLET | Freq: Four times a day (QID) | ORAL | 0 refills | Status: DC | PRN

## 2018-10-30 NOTE — ED Triage Notes (Signed)
States he fell backwards  Landing on tailbone about 1 week ago  conts to have lower back pain  Ambulates slowly d/t pain

## 2018-10-30 NOTE — ED Provider Notes (Signed)
[facility], [facility] Emergency Department Provider Note   ____________________________________________   First MD Initiated Contact with Patient 10/30/18 [phone]     (approximate)  I have reviewed the triage vital signs and the nursing notes.   HISTORY  Chief Complaint Back Pain and Fall   HPI Les PouCarlton Matthias HughsWayne Seifried is a 58 y.o. male states that he fell from approximately 4 feet onto concrete landing on his lower back and tailbone.  Patient has continued to have low back pain since that time.  He has not taken any over-the-counter medications as he has GI issues.  Patient denies any paresthesias into his lower extremities.  He denies any head injury or loss of consciousness during his fall.  He rates pain as an 8 out of 10.       Past Medical History:  Diagnosis Date  . Anxiety   . Arthritis   . GERD (gastroesophageal reflux disease)   . H/O knee surgery    bilateral   . Hyperlipidemia     Patient Active Problem List   Diagnosis Date Noted  . GERD (gastroesophageal reflux disease) 04/01/2017  . Hypertension 04/01/2017  . 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 History of alcohol abuse 04/01/2017  . Compulsive tobacco user syndrome 04/01/2017  . Osteoarthritis of knee 08/02/2016    Past Surgical History:  Procedure Laterality Date  . KNEE SURGERY Bilateral     Prior to Admission medications   Medication Sig Start Date End Date Taking? Authorizing Provider  atorvastatin (LIPITOR) 20 MG tablet Take 1 tablet (20 mg total) by mouth daily. 04/03/17   Margaretann LovelessBurnette, Jennifer M, PA-C  dexlansoprazole (DEXILANT) 60 MG capsule Take 60 mg by mouth daily.  02/13/17   [provider]  lisinopril (PRINIVIL,ZESTRIL) 10 MG tablet TAKE ONE TABLET BY MOUTH EVERY DAY 06/17/14   [provider]  methocarbamol (ROBAXIN) 500 MG tablet Take 1 tablet (500 mg total) by mouth every 6 (six) hours as needed. 10/30/18   Tommi RumpsSummers, Sanjith Siwek L, PA-C  metoprolol tartrate (LOPRESSOR) 50 MG tablet  TAKE ONE TABLET BY MOUTH 2 TIMES A DAY 06/17/14   [provider]  oxyCODONE (OXY IR/ROXICODONE) 5 MG immediate release tablet Take 1 tablet (5 mg total) by mouth every 6 (six) hours as needed for severe pain. 10/30/18   Tommi RumpsSummers, Birgitta Uhlir L, PA-C    Allergies Ibuprofen and Tylenol [acetaminophen]  Family History  Problem Relation Age of Onset  . Colon cancer Father   . Melanoma Sister     Social History Social History   Tobacco Use  . Smoking status: Current Every Day Smoker    Packs/day: 0.50    Years: 40.00    Pack years: 20.00    Types: Cigarettes  . Smokeless tobacco: Never Used  Substance Use Topics  . Alcohol use: Yes    Alcohol/week: 3.0 standard drinks    Types: 3 Cans of beer per week  . Drug use: Never    Review of Systems Constitutional: No fever/chills Eyes: No visual changes. ENT: No trauma. Cardiovascular: Denies chest pain. Respiratory: Denies shortness of breath. Gastrointestinal: No abdominal pain.  No nausea, no vomiting.  Genitourinary: Negative for dysuria. Musculoskeletal: Positive low back pain. Skin: Negative for rash. Neurological: Negative for headaches, focal weakness or numbness. ____________________________________________   PHYSICAL EXAM:  VITAL SIGNS: ED Triage Vitals  Enc Vitals Group     BP 10/30/18 0925 (!) 144/100     Pulse Rate 10/30/18 0925 90     Resp 10/30/18 0925  18     Temp 10/30/18 0925 98 F (36.7 C)     Temp Source 10/30/18 0925 Oral     SpO2 10/30/18 0925 97 %     Weight 10/30/18 0911 270 lb (122.5 kg)     Height 10/30/18 0911 6\' 2"  (1.88 m)     Head Circumference --      Peak Flow --      Pain Score 10/30/18 0911 8     Pain Loc --      Pain Edu? --      Excl. in [place]? --     Constitutional: Alert and oriented. Well appearing and in no acute distress. Eyes: Conjunctivae are normal.  Head: Atraumatic. Neck: No stridor.   Cardiovascular: Normal rate, regular rhythm. Grossly normal heart sounds.  Good  peripheral circulation. Respiratory: Normal respiratory effort.  No retractions. Lungs CTAB. Gastrointestinal: Soft and nontender. No distention.  Musculoskeletal: Examination of the back there is point tenderness on palpation of the lower lumbar spine.  No gross deformities noted.  No edema, abrasions or ecchymosis .  Range of motion is restricted secondary to patient's discomfort.  Patient has good muscle strength bilateral lower extremities.  He is able to stand and walk without assistance. Neurologic:  Normal speech and language. No gross focal neurologic deficits are appreciated. No gait instability. Skin:  Skin is warm, dry and intact.  No discoloration. Psychiatric: Mood and affect are normal. Speech and behavior are normal.  ____________________________________________   LABS (all labs ordered are listed, but only abnormal results are displayed)  Labs Reviewed - No data to display  RADIOLOGY  Official radiology report(s): Dg Lumbar Spine 2-3 Views  Result Date: 10/30/2018 CLINICAL DATA:  Low back pain after fall last week. EXAM: LUMBAR SPINE - 2-3 VIEW COMPARISON:  None. FINDINGS: No fracture or spondylolisthesis is noted. Mild degenerative disc disease is noted at L3-4 and L4-5 with anterior osteophyte formation. Anterior osteophyte formation is also noted at L1-2. IMPRESSION: Mild multilevel degenerative changes are noted. No acute abnormality seen in the lumbar spine. Electronically Signed   By: Marijo Conception M.D.   On: 10/30/2018 10:11    ____________________________________________   PROCEDURES  Procedure(s) performed (including Critical Care):  Procedures ____________________________________________   INITIAL IMPRESSION / ASSESSMENT AND PLAN / ED COURSE  As part of my medical decision making, I reviewed the following data within the electronic MEDICAL RECORD NUMBER Notes from prior ED visits and [place] Controlled Substance Database  58 year old male presents to the ED  with complaint of low back pain/coccyx for approximately 1 week.  Patient states that he fell backwards from approximately 4 feet.  He has not taken any over-the-counter medication as they all give him GI discomfort.  Patient denies any head injury or loss of consciousness during his fall.  X-rays today were reassuring and patient was made aware that he does not have a fracture.  He does however have some degenerative changes.  Patient was prescribed oxycodone 5 mg 1 every 6 hours as needed for pain and Robaxin every 6 hours as needed for muscle spasms.  He is encouraged to use ice or heat to his back as needed and to follow-up with his PCP if any continued problems.  ____________________________________________   FINAL CLINICAL IMPRESSION(S) / ED DIAGNOSES  Final diagnoses:  Acute bilateral low back pain without sciatica  Fall, initial encounter     ED Discharge Orders         Ordered  oxyCODONE (OXY IR/ROXICODONE) 5 MG immediate release tablet  Every 6 hours PRN     10/30/18 1041    methocarbamol (ROBAXIN) 500 MG tablet  Every 6 hours PRN     10/30/18 1041           Note:  This document was prepared using Dragon voice recognition software and may include unintentional dictation errors.    Tommi Rumps, PA-C 10/30/18 1541    Sharyn Creamer, MD 10/30/18 [phone]

## 2018-10-30 NOTE — Discharge Instructions (Addendum)
Follow-up with your primary care provider if any continued problems or concerns.  You may use ice or heat to your back as needed for discomfort.  Begin taking medication only as directed.  Do not drive or operate machinery while taking this medication as it could cause drowsiness and increase your risk for injury.  The methocarbamol is the muscle relaxant and oxycodone is the pain medication.  Return to the emergency department if any severe worsening of your symptoms such as loss of bowel or bladder control.

## 2019-02-28 ENCOUNTER — Emergency Department: Payer: Medicaid Other

## 2019-02-28 ENCOUNTER — Emergency Department
Admission: EM | Admit: 2019-02-28 | Discharge: 2019-02-28 | Disposition: A | Discharge status: Home / Self Care | Payer: Medicaid Other | Attending: Emergency Medicine | Admitting: Emergency Medicine

## 2019-02-28 ENCOUNTER — Other Ambulatory Visit: Payer: Self-pay

## 2019-02-28 DIAGNOSIS — M7989 Other specified soft tissue disorders: Secondary | ICD-10-CM | POA: Diagnosis not present

## 2019-02-28 DIAGNOSIS — L03012 Cellulitis of left finger: Secondary | ICD-10-CM | POA: Diagnosis not present

## 2019-02-28 DIAGNOSIS — M79645 Pain in left finger(s): Secondary | ICD-10-CM | POA: Diagnosis present

## 2019-02-28 DIAGNOSIS — L089 Local infection of the skin and subcutaneous tissue, unspecified: Secondary | ICD-10-CM | POA: Diagnosis not present

## 2019-02-28 DIAGNOSIS — I10 Essential (primary) hypertension: Secondary | ICD-10-CM | POA: Diagnosis not present

## 2019-02-28 DIAGNOSIS — F17210 Nicotine dependence, cigarettes, uncomplicated: Secondary | ICD-10-CM | POA: Insufficient documentation

## 2019-02-28 DIAGNOSIS — Z79899 Other long term (current) drug therapy: Secondary | ICD-10-CM | POA: Insufficient documentation

## 2019-02-28 LAB — BASIC METABOLIC PANEL
Anion gap: 11 (ref 5–15)
BUN: 13 mg/dL (ref 6–20)
CO2: 22 mmol/L (ref 22–32)
Calcium: 9.3 mg/dL (ref 8.9–10.3)
Chloride: 101 mmol/L (ref 98–111)
Creatinine, Ser: 1.1 mg/dL (ref 0.61–1.24)
GFR calc Af Amer: >60 mL/min (ref >60)
GFR calc non Af Amer: >60 mL/min (ref >60)
Glucose, Bld: 133 mg/dL — ABNORMAL HIGH (ref 70–99)
Potassium: 3.7 mmol/L (ref 3.5–5.1)
Sodium: 134 mmol/L — ABNORMAL LOW (ref 135–145)

## 2019-02-28 LAB — CBC
HCT: 53.7 % — ABNORMAL HIGH (ref 39.0–52.0)
Hemoglobin: 18.3 g/dL — ABNORMAL HIGH (ref 13.0–17.0)
MCH: 31.5 pg (ref 26.0–34.0)
MCHC: 34.1 g/dL (ref 30.0–36.0)
MCV: 92.4 fL (ref 80.0–100.0)
Platelets: 234 10*3/uL (ref 150–400)
RBC: 5.81 MIL/uL (ref 4.22–5.81)
RDW: 12.9 % (ref 11.5–15.5)
WBC: 11.3 10*3/uL — ABNORMAL HIGH (ref 4.0–10.5)
nRBC: 0 % (ref 0.0–0.2)

## 2019-02-28 MED ORDER — OXYCODONE HCL 5 MG PO TABS: 5.0000 mg | ORAL_TABLET | Freq: Four times a day (QID) | ORAL | 0 refills | Status: DC | PRN

## 2019-02-28 MED ORDER — CLINDAMYCIN PHOSPHATE 600 MG/50ML IV SOLN
600.0000 mg | Freq: Once | INTRAVENOUS | Status: AC
  Administered 2019-02-28: 600 mg via INTRAVENOUS
  Filled 2019-02-28: qty 50

## 2019-02-28 MED ORDER — LIDOCAINE HCL (PF) 1 % IJ SOLN
5.0000 mL | Freq: Once | INTRAMUSCULAR | Status: AC
  Administered 2019-02-28: 5 mL
  Filled 2019-02-28: qty 5

## 2019-02-28 MED ORDER — MORPHINE SULFATE (PF) 4 MG/ML IV SOLN
4.0000 mg | Freq: Once | INTRAVENOUS | Status: AC
  Administered 2019-02-28: 4 mg via INTRAVENOUS
  Filled 2019-02-28: qty 1

## 2019-02-28 MED ORDER — CLINDAMYCIN HCL 300 MG PO CAPS: 300.0000 mg | ORAL_CAPSULE | Freq: Four times a day (QID) | ORAL | 0 refills | Status: AC

## 2019-02-28 MED ORDER — MORPHINE SULFATE (PF) 2 MG/ML IV SOLN
2.0000 mg | Freq: Once | INTRAVENOUS | Status: AC
  Administered 2019-02-28: 2 mg via INTRAVENOUS
  Filled 2019-02-28: qty 1

## 2019-02-28 NOTE — ED Notes (Signed)
Pt reports he didn't take his BP medication this morning.

## 2019-02-28 NOTE — Discharge Instructions (Signed)
Please change dressing daily.  Wait 24 hours before first dressing change.  Take antibiotics as prescribed.  Call orthopedic office Monday morning to schedule follow-up appointment for Wednesday, 03/04/2019.  Soak finger in half tap water half hydrogen peroxide for 5 minutes 3 times a day.  Return to the ER for any increasing pain swelling warmth or redness.

## 2019-02-28 NOTE — ED Triage Notes (Addendum)
Pt c/o left middle finger pain and swelling that started Wednesday. Pt denies injury or exposure to irritants. Pt states he tried taking benadryl w/out improvement.  Pt's left middle finger is notable swollen at the third joint and red with white discharge along border of nailbed. Pt is able to move finger, cap refill <3 in affected finger, sensation intact.

## 2019-02-28 NOTE — ED Triage Notes (Signed)
Pt reports he did not take his home BP medication this morning.

## 2019-02-28 NOTE — ED Notes (Signed)
Lidocaine given to ED provider.

## 2019-02-28 NOTE — ED Provider Notes (Signed)
[facility] EMERGENCY DEPARTMENT Provider Note   CSN: [id number] Arrival date & time: 02/28/19  1145     History Chief Complaint  Patient presents with  . Finger Injury    Steven Escobar is a 59 y.o. male presents to the emergency department for evaluation of left third digit pain and swelling.  Patient states he bites his nails.  Developed pain and swelling along the dorsal aspect of the left third digit at the base of the nail.  He has had some purulent drainage.  Also having little bit of swelling discomfort along the pulp space.  He denies any numbness or tingling.  Able to move the digit.  He has not had a medications for pain or any antibiotics.  HPI     Past Medical History:  Diagnosis Date  . Anxiety   . Arthritis   . GERD (gastroesophageal reflux disease)   . H/O knee surgery    bilateral   . Hyperlipidemia     Patient Active Problem List   Diagnosis Date Noted  . GERD (gastroesophageal reflux disease) 04/01/2017  . Hypertension 04/01/2017  . 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 History of alcohol abuse 04/01/2017  . Compulsive tobacco user syndrome 04/01/2017  . Osteoarthritis of knee 08/02/2016    Past Surgical History:  Procedure Laterality Date  . KNEE SURGERY Bilateral        Family History  Problem Relation Age of Onset  . Colon cancer Father   . Melanoma Sister     Social History   Tobacco Use  . Smoking status: Current Every Day Smoker    Packs/day: 0.50    Years: 40.00    Pack years: 20.00    Types: Cigarettes  . Smokeless tobacco: Never Used  Substance Use Topics  . Alcohol use: Yes    Alcohol/week: 3.0 standard drinks    Types: 3 Cans of beer per week  . Drug use: Never    Home Medications Prior to Admission medications   Medication Sig Start Date End Date Taking? Authorizing Provider  atorvastatin (LIPITOR) 20 MG tablet Take 1 tablet (20 mg total) by mouth daily. 04/03/17   Margaretann Loveless, PA-C  clindamycin  (CLEOCIN) 300 MG capsule Take 1 capsule (300 mg total) by mouth 4 (four) times daily for 7 days. 02/28/19 03/07/19  Evon Slack, PA-C  dexlansoprazole (DEXILANT) 60 MG capsule Take 60 mg by mouth daily.  02/13/17   [provider]  lisinopril (PRINIVIL,ZESTRIL) 10 MG tablet TAKE ONE TABLET BY MOUTH EVERY DAY 06/17/14   [provider]  methocarbamol (ROBAXIN) 500 MG tablet Take 1 tablet (500 mg total) by mouth every 6 (six) hours as needed. 10/30/18   Tommi Rumps, PA-C  metoprolol tartrate (LOPRESSOR) 50 MG tablet TAKE ONE TABLET BY MOUTH 2 TIMES A DAY 06/17/14   [provider]  oxyCODONE (ROXICODONE) 5 MG immediate release tablet Take 1 tablet (5 mg total) by mouth every 6 (six) hours as needed. 02/28/19 02/28/20  Evon Slack, PA-C    Allergies    Ibuprofen and Tylenol [acetaminophen]  Review of Systems   Review of Systems  Constitutional: Negative for fever.  Musculoskeletal: Positive for arthralgias and joint swelling.  Skin: Positive for rash and wound.  Neurological: Negative for dizziness and headaches.    Physical Exam Updated Vital Signs BP (!) 169/117 (BP Location: Right Arm)   Pulse (!) 102   Temp 98.1 F (36.7 C)   Resp 18  Ht 6\' 2"  (1.88 m)   Wt 122 kg   SpO2 98%   BMI 34.53 kg/m   Physical Exam Constitutional:      Appearance: He is well-developed.  HENT:     Head: Normocephalic and atraumatic.  Eyes:     Conjunctiva/sclera: Conjunctivae normal.  Cardiovascular:     Rate and Rhythm: Normal rate.  Pulmonary:     Effort: Pulmonary effort is normal. No respiratory distress.  Musculoskeletal:        General: Swelling present. Normal range of motion.     Cervical back: Normal range of motion.     Comments: Left third digit with paronychial infection along the base of the nail with fluctuance and purulent drainage.  Purulent drainage underneath the nail noted.  Swelling noted along the pulp space with no erythema.  No  fluctuance along the pulp space.  Patient slightly tender to palpation along the pulp space.  No tendon deficits noted.  Skin:    General: Skin is warm.     Findings: No rash.  Neurological:     Mental Status: He is alert and oriented to person, place, and time.  Psychiatric:        Behavior: Behavior normal.        Thought Content: Thought content normal.     ED Results / Procedures / Treatments   Labs (all labs ordered are listed, but only abnormal results are displayed) Labs Reviewed  CBC - Abnormal; Notable for the following components:      Result Value   WBC 11.3 (*)    Hemoglobin 18.3 (*)    HCT 53.7 (*)    All other components within normal limits  BASIC METABOLIC PANEL - Abnormal; Notable for the following components:   Sodium 134 (*)    Glucose, Bld 133 (*)    All other components within normal limits  AEROBIC/ANAEROBIC CULTURE (SURGICAL/DEEP WOUND)    EKG None  Radiology DG Finger Middle Left  Result Date: 02/28/2019 CLINICAL DATA:  Infection in left third digit, no history of trauma EXAM: LEFT MIDDLE FINGER 2+V COMPARISON:  None FINDINGS: Swelling about the distal aspect of the left third digit. Noted along the dorsal surface and more along the ulnar aspect. No signs of bony destruction or fracture. No visible foreign body. IMPRESSION: Soft tissue swelling about the distal aspect of the left third digit. No signs of bony destruction or fracture. Electronically Signed   By: 03/02/2019 M.D.   On: 02/28/2019 12:56    Procedures .Nerve Block  Date/Time: 02/28/2019 2:20 PM Performed by: 03/02/2019, PA-C Authorized by: Evon Slack, PA-C   Consent:    Consent obtained:  Verbal   Consent given by:  Patient Location:    Body area:  Upper extremity   Laterality:  Left (Third digit) Pre-procedure details:    Skin preparation:  Povidone-iodine Procedure details (see MAR for exact dosages):    Block needle gauge:  25 G   Anesthetic injected:   Lidocaine 1% w/o epi   Injection procedure:  Anatomic landmarks identified   Paresthesia:  Immediately resolved Post-procedure details:    Patient tolerance of procedure:  Tolerated well, no immediate complications .Nail Removal  Date/Time: 02/28/2019 2:21 PM Performed by: 03/02/2019, PA-C Authorized by: Evon Slack, PA-C   Consent:    Consent obtained:  Verbal   Consent given by:  Patient Location:    Hand:  L long finger Pre-procedure details:  Skin preparation:  Betadine Anesthesia (see MAR for exact dosages):    Anesthesia method:  Nerve block   Block needle gauge:  25 G   Block anesthetic:  Lidocaine 1% w/o epi   Block injection procedure:  Anatomic landmarks identified Nail Removal:    Nail removed:  Complete Post-procedure details:    Dressing:  Petrolatum-impregnated gauze   Patient tolerance of procedure:  Tolerated well, no immediate complications .Marland KitchenIncision and Drainage  Date/Time: 02/28/2019 2:22 PM Performed by: Duanne Guess, PA-C Authorized by: Duanne Guess, PA-C   Consent:    Consent obtained:  Verbal   Consent given by:  Patient Location:    Type:  Abscess Pre-procedure details:    Skin preparation:  Betadine Anesthesia (see MAR for exact dosages):    Anesthesia method:  Nerve block Procedure details:    Incision types:  Stab incision   Incision depth:  Dermal   Scalpel blade:  11   Drainage:  Purulent and bloody   Drainage amount:  Moderate   Wound treatment:  Wound left open Post-procedure details:    Patient tolerance of procedure:  Tolerated well, no immediate complications   (including critical care time)  Medications Ordered in ED Medications  lidocaine (PF) (XYLOCAINE) 1 % injection 5 mL (has no administration in time range)  clindamycin (CLEOCIN) IVPB 600 mg (has no administration in time range)  morphine 4 MG/ML injection 4 mg (4 mg Intravenous Given 02/28/19 1241)  morphine 2 MG/ML injection 2 mg (2 mg Intravenous  Given 02/28/19 1418)    ED Course  I have reviewed the triage vital signs and the nursing notes.  Pertinent labs & imaging results that were available during my care of the patient were reviewed by me and considered in my medical decision making (see chart for details).    MDM Rules/Calculators/A&P                      60 year old male with left third digit paronychial infection.  Slight pulp space swelling but no fluctuance.  Patient given IV morphine for pain.  Purulent drainage expressed and cultured.  X-rays obtained, reviewed by me showing no abnormal bony lesion, foreign body.  No signs of osteomyelitis.  Call orthopedics to discuss patient's history and presentation.  Orthopedics recommended incision and drainage of the paronychia infection, removal of the nail for help with continued drainage of the infection and follow-up Wednesday of next week in the orthopedic office.  Patient's pain well controlled and completely resolved with digital block.  Nail was removed and incision and drainage performed.  Patient tolerated procedure well.  He is started on IV clindamycin and given a prescription for p.o. clindamycin and oxycodone to take at home.  He understands instructions on follow-up and signs and symptoms return to ED for. Final Clinical Impression(s) / ED Diagnoses Final diagnoses:  Paronychia of left middle finger    Rx / DC Orders ED Discharge Orders         Ordered    oxyCODONE (ROXICODONE) 5 MG immediate release tablet  Every 6 hours PRN     02/28/19 1411    clindamycin (CLEOCIN) 300 MG capsule  4 times daily     02/28/19 1411           Renata Caprice 02/28/19 1424    Harvest Dark, MD 02/28/19 1512

## 2019-03-05 LAB — AEROBIC/ANAEROBIC CULTURE W GRAM STAIN (SURGICAL/DEEP WOUND): Culture: NORMAL

## 2019-05-15 ENCOUNTER — Ambulatory Visit: Payer: Self-pay | Admitting: Physician Assistant

## 2019-05-15 ENCOUNTER — Telehealth: Payer: Self-pay

## 2019-05-15 NOTE — Telephone Encounter (Signed)
Copied from CRM [id number]. Topic: Clinical - COVID Pre-Screen >> May 15, 2019  1:46 PM Dalphine Handing A wrote: 1. To the best of your knowledge, have you been in close contact with anyone with a confirmed diagnosis of COVID 19?  no  If no - Proceed to next question; If yes - Schedule patient for a virtual visit  2. Have you had any one or more of the following: fever, chills, cough, shortness of breath or any flu-like symptoms?  no  If no - Proceed to next question; If yes - Schedule patient for a virtual visit  3. Have you been diagnosed with or have a previous diagnosis of COVID 19?  no  If no - Proceed to next question; If yes - Schedule patient for a virtual visit  4. I am going to go over a few other symptoms with you. Please let me know if you are experiencing any of the following: no  Ear, nose or throat discomfort  A sore throat  Headache  Muscle pain  Diarrhea  Loss of taste or smell  If no - Continue with scheduling process; If yes - Document in scheduling notes   Thank you for answering these questions. Please know we will ask you these questions or similar questions when you arrive for your appointment and again it's how we are keeping everyone safe. Also, to keep you safe, please use the provided hand sanitizer when you enter the building. [facility], we are asking everyone in the building to wear a mask because they help [place] prevent the spread of germs.   Do you have a mask of your own, if not, we are happy to provide one for you. The last thing I want to go over with you is the no visitor guidelines. This means no one can attend the appointment with you unless you need physical assistance. I understand this may be different from your past appointments and I know this may be difficult but please know if someone is driving you we are happy to call them for you once your appointment is over.

## 2019-05-28 ENCOUNTER — Ambulatory Visit (INDEPENDENT_AMBULATORY_CARE_PROVIDER_SITE_OTHER): Payer: Medicaid Other | Admitting: Physician Assistant

## 2019-05-28 ENCOUNTER — Encounter: Payer: Self-pay | Admitting: Physician Assistant

## 2019-05-28 ENCOUNTER — Other Ambulatory Visit: Payer: Self-pay

## 2019-05-28 VITALS — BP 159/112 | HR 82 | Temp 97.1°F | Resp 16 | Ht 72.0 in | Wt 271.8 lb

## 2019-05-28 DIAGNOSIS — M1711 Unilateral primary osteoarthritis, right knee: Secondary | ICD-10-CM | POA: Diagnosis not present

## 2019-05-28 DIAGNOSIS — F418 Other specified anxiety disorders: Secondary | ICD-10-CM

## 2019-05-28 DIAGNOSIS — I10 Essential (primary) hypertension: Secondary | ICD-10-CM | POA: Diagnosis not present

## 2019-05-28 DIAGNOSIS — Z6836 Body mass index (BMI) 36.0-36.9, adult: Secondary | ICD-10-CM | POA: Diagnosis not present

## 2019-05-28 DIAGNOSIS — K219 Gastro-esophageal reflux disease without esophagitis: Secondary | ICD-10-CM | POA: Diagnosis not present

## 2019-05-28 DIAGNOSIS — E7800 Pure hypercholesterolemia, unspecified: Secondary | ICD-10-CM

## 2019-05-28 MED ORDER — ALPRAZOLAM 0.5 MG PO TABS: 0.5000 mg | ORAL_TABLET | Freq: Every evening | ORAL | 5 refills | Status: DC | PRN

## 2019-05-28 MED ORDER — DEXILANT 60 MG PO CPDR: 60.0000 mg | DELAYED_RELEASE_CAPSULE | Freq: Every day | ORAL | 3 refills | Status: DC

## 2019-05-28 MED ORDER — AMLODIPINE BESYLATE 10 MG PO TABS: 10.0000 mg | ORAL_TABLET | Freq: Every day | ORAL | 3 refills | Status: DC

## 2019-05-28 MED ORDER — ATORVASTATIN CALCIUM 20 MG PO TABS: 20.0000 mg | ORAL_TABLET | Freq: Every day | ORAL | 3 refills | Status: DC

## 2019-05-28 MED ORDER — CELECOXIB 100 MG PO CAPS: 100.0000 mg | ORAL_CAPSULE | Freq: Two times a day (BID) | ORAL | 5 refills | Status: DC | PRN

## 2019-05-28 NOTE — Patient Instructions (Signed)
DASH Eating Plan DASH stands for "Dietary Approaches to Stop Hypertension." The DASH eating plan is a healthy eating plan that has been shown to reduce high blood pressure (hypertension). It may also reduce your risk for type 2 diabetes, heart disease, and stroke. The DASH eating plan may also help with weight loss. What are tips for following this plan?  General guidelines  Avoid eating more than 2,300 mg (milligrams) of salt (sodium) a day. If you have hypertension, you may need to reduce your sodium intake to 1,500 mg a day.  Limit alcohol intake to no more than 1 drink a day for nonpregnant women and 2 drinks a day for men. One drink equals 12 oz of beer, 5 oz of wine, or 1 oz of hard liquor.  Work with your health care provider to maintain a healthy body weight or to lose weight. Ask what an ideal weight is for you.  Get at least 30 minutes of exercise that causes your heart to beat faster (aerobic exercise) most days of the week. Activities may include walking, swimming, or biking.  Work with your health care provider or diet and nutrition specialist (dietitian) to adjust your eating plan to your individual calorie needs. Reading food labels   Check food labels for the amount of sodium per serving. Choose foods with less than 5 percent of the Daily Value of sodium. Generally, foods with less than 300 mg of sodium per serving fit into this eating plan.  To find whole grains, look for the word "whole" as the first word in the ingredient list. Shopping  Buy products labeled as "low-sodium" or "no salt added."  Buy fresh foods. Avoid canned foods and premade or frozen meals. Cooking  Avoid adding salt when cooking. Use salt-free seasonings or herbs instead of table salt or sea salt. Check with your health care provider or pharmacist before using salt substitutes.  Do not fry foods. Cook foods using healthy methods such as baking, boiling, grilling, and broiling instead.  Cook with  heart-healthy oils, such as olive, canola, soybean, or sunflower oil. Meal planning  Eat a balanced diet that includes: ? 5 or more servings of fruits and vegetables each day. At each meal, try to fill half of your plate with fruits and vegetables. ? Up to 6-8 servings of whole grains each day. ? Less than 6 oz of lean meat, poultry, or fish each day. A 3-oz serving of meat is about the same size as a deck of cards. One egg equals 1 oz. ? 2 servings of low-fat dairy each day. ? A serving of nuts, seeds, or beans 5 times each week. ? Heart-healthy fats. Healthy fats called Omega-3 fatty acids are found in foods such as flaxseeds and coldwater fish, like sardines, salmon, and mackerel.  Limit how much you eat of the following: ? Canned or prepackaged foods. ? Food that is high in trans fat, such as fried foods. ? Food that is high in saturated fat, such as fatty meat. ? Sweets, desserts, sugary drinks, and other foods with added sugar. ? Full-fat dairy products.  Do not salt foods before eating.  Try to eat at least 2 vegetarian meals each week.  Eat more home-cooked food and less restaurant, buffet, and fast food.  When eating at a restaurant, ask that your food be prepared with less salt or no salt, if possible. What foods are recommended? The items listed may not be a complete list. Talk with your dietitian about   what dietary choices are best for you. Grains Whole-grain or whole-wheat bread. Whole-grain or whole-wheat pasta. Brown rice. Oatmeal. Quinoa. Bulgur. Whole-grain and low-sodium cereals. Pita bread. Low-fat, low-sodium crackers. Whole-wheat flour tortillas. Vegetables Fresh or frozen vegetables (raw, steamed, roasted, or grilled). Low-sodium or reduced-sodium tomato and vegetable juice. Low-sodium or reduced-sodium tomato sauce and tomato paste. Low-sodium or reduced-sodium canned vegetables. Fruits All fresh, dried, or frozen fruit. Canned fruit in natural juice (without  added sugar). Meat and other protein foods Skinless chicken or turkey. Ground chicken or turkey. Pork with fat trimmed off. Fish and seafood. Egg whites. Dried beans, peas, or lentils. Unsalted nuts, nut butters, and seeds. Unsalted canned beans. Lean cuts of beef with fat trimmed off. Low-sodium, lean deli meat. Dairy Low-fat (1%) or fat-free (skim) milk. Fat-free, low-fat, or reduced-fat cheeses. Nonfat, low-sodium ricotta or cottage cheese. Low-fat or nonfat yogurt. Low-fat, low-sodium cheese. Fats and oils Soft margarine without trans fats. Vegetable oil. Low-fat, reduced-fat, or light mayonnaise and salad dressings (reduced-sodium). Canola, safflower, olive, soybean, and sunflower oils. Avocado. Seasoning and other foods Herbs. Spices. Seasoning mixes without salt. Unsalted popcorn and pretzels. Fat-free sweets. What foods are not recommended? The items listed may not be a complete list. Talk with your dietitian about what dietary choices are best for you. Grains Baked goods made with fat, such as croissants, muffins, or some breads. Dry pasta or rice meal packs. Vegetables Creamed or fried vegetables. Vegetables in a cheese sauce. Regular canned vegetables (not low-sodium or reduced-sodium). Regular canned tomato sauce and paste (not low-sodium or reduced-sodium). Regular tomato and vegetable juice (not low-sodium or reduced-sodium). Pickles. Olives. Fruits Canned fruit in a light or heavy syrup. Fried fruit. Fruit in cream or butter sauce. Meat and other protein foods Fatty cuts of meat. Ribs. Fried meat. Bacon. Sausage. Bologna and other processed lunch meats. Salami. Fatback. Hotdogs. Bratwurst. Salted nuts and seeds. Canned beans with added salt. Canned or smoked fish. Whole eggs or egg yolks. Chicken or turkey with skin. Dairy Whole or 2% milk, cream, and half-and-half. Whole or full-fat cream cheese. Whole-fat or sweetened yogurt. Full-fat cheese. Nondairy creamers. Whipped toppings.  Processed cheese and cheese spreads. Fats and oils Butter. Stick margarine. Lard. Shortening. Ghee. Bacon fat. Tropical oils, such as coconut, palm kernel, or palm oil. Seasoning and other foods Salted popcorn and pretzels. Onion salt, garlic salt, seasoned salt, table salt, and sea salt. Worcestershire sauce. Tartar sauce. Barbecue sauce. Teriyaki sauce. Soy sauce, including reduced-sodium. Steak sauce. Canned and packaged gravies. Fish sauce. Oyster sauce. Cocktail sauce. Horseradish that you find on the shelf. Ketchup. Mustard. Meat flavorings and tenderizers. Bouillon cubes. Hot sauce and Tabasco sauce. Premade or packaged marinades. Premade or packaged taco seasonings. Relishes. Regular salad dressings. Where to find more information:  National Heart, Lung, and Blood Institute: www.nhlbi.nih.gov  American Heart Association: www.heart.org Summary  The DASH eating plan is a healthy eating plan that has been shown to reduce high blood pressure (hypertension). It may also reduce your risk for type 2 diabetes, heart disease, and stroke.  With the DASH eating plan, you should limit salt (sodium) intake to 2,300 mg a day. If you have hypertension, you may need to reduce your sodium intake to 1,500 mg a day.  When on the DASH eating plan, aim to eat more fresh fruits and vegetables, whole grains, lean proteins, low-fat dairy, and heart-healthy fats.  Work with your health care provider or diet and nutrition specialist (dietitian) to adjust your eating plan to your   individual calorie needs. This information is not intended to replace advice given to you by your health care provider. Make sure you discuss any questions you have with your health care provider. Document Revised: 11/30/2016 Document Reviewed: 12/12/2015 Elsevier Patient Education  2020 Elsevier Inc.  

## 2019-05-28 NOTE — Progress Notes (Signed)
Established patient visit   Patient: Steven Escobar   DOB: 26-Jul-1960   59 y.o. Male  MRN: [id number] Visit Date: 05/28/2019  Today's healthcare provider: Margaretann Loveless, PA-C   Chief Complaint  Patient presents with  . Hypertension  . Hyperlipidemia  . Anxiety  . Gastroesophageal Reflux  . Knee Pain   Subjective    HPI GERD, Follow up: Patient hasn't been seen for 2 years. Reports that his is currently on Dexilant 60mg .  He reports excellent compliance with treatment. He is not having side effects.   He IS experiencing none. He is NOT experiencing abdominal bloating, belching, chest pain, choking on food, cough, deep pressure at base of neck, difficulty swallowing, early satiety, fullness after meals, heartburn, hoarseness, nausea, nocturnal burning, shortness of breath or upper abdominal discomfort  ----------------------------------------------------------------------------------------- Hypertension, follow-up  BP Readings from Last 3 Encounters:  05/28/19 (!) 159/112  02/28/19 (!) 183/102  10/30/18 (!) 144/100   Wt Readings from Last 3 Encounters:  05/28/19 271 lb 12.8 oz (123.3 kg)  02/28/19 268 lb 15.4 oz (122 kg)  10/30/18 270 lb (122.5 kg)     Patient currently on Amlodipine 5mg  daily and Metoprolol 50mg  BID but reports that he gets dizzy on the Metoprolol and would like the dosage decrease. He does report he is not taking metoprolol currently. He reports excellent compliance with treatment. He is not having side effects.  He is following a Regular diet. He is not exercising. He does smoke.   Outside blood pressures are not being checked. Symptoms: No chest pain No chest pressure  No palpitations No syncope  No dyspnea No orthopnea  No paroxysmal nocturnal dyspnea No lower extremity edema   Pertinent labs: Lab Results  Component Value Date   CHOL 227 (H) 04/02/2017   HDL 38 (L) 04/02/2017   LDLCALC 148 (H) 04/02/2017   TRIG 206 (H)  04/02/2017   Lab Results  Component Value Date   NA 134 (L) 02/28/2019   K 3.7 02/28/2019   CREATININE 1.10 02/28/2019   GFRNONAA >60 02/28/2019   GFRAA >60 02/28/2019   GLUCOSE 133 (H) 02/28/2019     The 10-year ASCVD risk score 03/02/2019 DC Jr., et al., 2013) is: 21.6%   --------------------------------------------------------------------------------------------------- Lipid/Cholesterol, Follow-up  Last lipid panel Other pertinent labs  Lab Results  Component Value Date   CHOL 227 (H) 04/02/2017   HDL 38 (L) 04/02/2017   LDLCALC 148 (H) 04/02/2017   TRIG 206 (H) 04/02/2017   Lab Results  Component Value Date   ALT 46 (H) 04/02/2017   AST 19 04/02/2017   PLT 234 02/28/2019      He reports excellent compliance with treatment. He is not having side effects.   Symptoms: No chest pain No chest pressure/discomfort  No dyspnea No lower extremity edema  No numbness or tingling of extremity No orthopnea  No palpitations No paroxysmal nocturnal dyspnea  No speech difficulty No syncope   Current diet: well balanced Current exercise: none  The 10-year ASCVD risk score 06/02/2017 DC Jr., et al., 2013) is: 21.6%  --------------------------------------------------------------------------------------------------- Anxiety: Reports that he feels stable with his symptoms. Needs Refills. GAD 7 : Generalized Anxiety Score 05/28/2019  Nervous, Anxious, on Edge 3  Control/stop worrying 2  Worry too much - different things 2  Trouble relaxing 1  Restless 3  Easily annoyed or irritable 2  Afraid - awful might happen 1  Total GAD 7 Score 14  Anxiety Difficulty Somewhat difficult  Depression screen [facility] 2/9 05/28/2019 04/01/2017  Decreased Interest 3 3  Down, Depressed, Hopeless 0 0  PHQ - 2 Score 3 3  Altered sleeping 3 0  Tired, decreased energy 2 1  Change in appetite 0 0  Feeling bad or failure about yourself  0 0  Trouble concentrating 0 0  Moving slowly or fidgety/restless 0 0    Suicidal thoughts 0 0  PHQ-9 Score 8 4  Difficult doing work/chores Somewhat difficult Not difficult at all   Patient also c/o right knee pain. Reports that this been happening a couple of years. He has had knee surgery. Would like to get on Celebrex for the pain.  Patient Active Problem List   Diagnosis Date Noted  . GERD (gastroesophageal reflux disease) 04/01/2017  . Hypertension 04/01/2017  . 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 History of alcohol abuse 04/01/2017  . Compulsive tobacco user syndrome 04/01/2017  . Osteoarthritis of knee 08/02/2016   Past Medical History:  Diagnosis Date  . Anxiety   . Arthritis   . GERD (gastroesophageal reflux disease)   . H/O knee surgery    bilateral   . Hyperlipidemia        Medications: Outpatient Medications Prior to Visit  Medication Sig Note  . [DISCONTINUED] amLODipine (NORVASC) 5 MG tablet Take by mouth.   . [DISCONTINUED] atorvastatin (LIPITOR) 20 MG tablet Take 1 tablet (20 mg total) by mouth daily.   . [DISCONTINUED] dexlansoprazole (DEXILANT) 60 MG capsule Take 60 mg by mouth daily.    . [DISCONTINUED] metoprolol tartrate (LOPRESSOR) 50 MG tablet TAKE ONE TABLET BY MOUTH 2 TIMES A DAY 05/28/2019: effec  . [DISCONTINUED] ALPRAZolam (XANAX) 0.5 MG tablet alprazolam 0.5 mg tablet  TK 1 T PO  BID PRF ANXIETY   . [DISCONTINUED] lisinopril (PRINIVIL,ZESTRIL) 10 MG tablet TAKE ONE TABLET BY MOUTH EVERY DAY   . [DISCONTINUED] methocarbamol (ROBAXIN) 500 MG tablet Take 1 tablet (500 mg total) by mouth every 6 (six) hours as needed. (Patient not taking: Reported on 05/28/2019)   . [DISCONTINUED] oxyCODONE (ROXICODONE) 5 MG immediate release tablet Take 1 tablet (5 mg total) by mouth every 6 (six) hours as needed. (Patient not taking: Reported on 05/28/2019)    No facility-administered medications prior to visit.    Review of Systems  Constitutional: Negative.   Eyes: Negative for visual disturbance.  Respiratory: Negative for cough, chest  tightness and shortness of breath.   Cardiovascular: Negative for chest pain, palpitations and leg swelling.  Neurological: Negative for weakness and headaches.  Psychiatric/Behavioral: Positive for sleep disturbance. Negative for agitation, behavioral problems, decreased concentration and dysphoric mood. The patient is nervous/anxious.     Last CBC Lab Results  Component Value Date   WBC 11.3 (H) 02/28/2019   HGB 18.3 (H) 02/28/2019   HCT 53.7 (H) 02/28/2019   MCV 92.4 02/28/2019   MCH 31.5 02/28/2019   RDW 12.9 02/28/2019   PLT 234 63/84/6659   Last metabolic panel Lab Results  Component Value Date   GLUCOSE 133 (H) 02/28/2019   NA 134 (L) 02/28/2019   K 3.7 02/28/2019   CL 101 02/28/2019   CO2 22 02/28/2019   BUN 13 02/28/2019   CREATININE 1.10 02/28/2019   GFRNONAA >60 02/28/2019   GFRAA >60 02/28/2019   CALCIUM 9.3 02/28/2019   PROT 7.3 04/02/2017   ALBUMIN 4.2 04/02/2017   LABGLOB 3.1 04/02/2017   AGRATIO 1.4 04/02/2017   BILITOT 0.4 04/02/2017   ALKPHOS 63 04/02/2017   AST 19 04/02/2017  ALT 46 (H) 04/02/2017   ANIONGAP 11 02/28/2019   Last lipids Lab Results  Component Value Date   CHOL 227 (H) 04/02/2017   HDL 38 (L) 04/02/2017   LDLCALC 148 (H) 04/02/2017   TRIG 206 (H) 04/02/2017   Last hemoglobin A1c No results found for: HGBA1C    Objective    BP (!) 159/112 (BP Location: Left Arm, Patient Position: Sitting, Cuff Size: Large)   Pulse 82   Temp (!) 97.1 F (36.2 C) (Temporal)   Resp 16   Ht 6' (1.829 m)   Wt 271 lb 12.8 oz (123.3 kg)   BMI 36.86 kg/m  BP Readings from Last 3 Encounters:  05/28/19 (!) 159/112  02/28/19 (!) 183/102  10/30/18 (!) 144/100   Wt Readings from Last 3 Encounters:  05/28/19 271 lb 12.8 oz (123.3 kg)  02/28/19 268 lb 15.4 oz (122 kg)  10/30/18 270 lb (122.5 kg)      Physical Exam Vitals reviewed.  Constitutional:      General: He is not in acute distress.    Appearance: Normal appearance. He is  well-developed. He is obese. He is not ill-appearing or diaphoretic.  HENT:     Head: Normocephalic and atraumatic.  Neck:     Thyroid: No thyromegaly.     Vascular: No JVD.     Trachea: No tracheal deviation.  Cardiovascular:     Rate and Rhythm: Normal rate and regular rhythm.     Pulses: Normal pulses.     Heart sounds: Normal heart sounds. No murmur. No friction rub. No gallop.   Pulmonary:     Effort: Pulmonary effort is normal. No respiratory distress.     Breath sounds: Normal breath sounds. No wheezing or rales.  Musculoskeletal:     Cervical back: Normal range of motion and neck supple.  Lymphadenopathy:     Cervical: No cervical adenopathy.  Neurological:     Mental Status: He is alert.  Psychiatric:        Mood and Affect: Mood normal.        Thought Content: Thought content normal.      No results found for any visits on 05/28/19.  Assessment & Plan     1. Primary osteoarthritis of right knee Patient uses celebrex 100mg  prn. Discussed Voltaren gel OTC to offer NSAID treatment without irritating stomach.   2. Essential hypertension Not taking medications regularly, ran out. Amlodipine increased to 10mg  from 5mg  and metoprolol D/C (causes dizziness). F/U HTN in 4 weeks.   3. Hypercholesteremia Stable. Continue Atorvastatin 20mg .   4. Gastroesophageal reflux disease without esophagitis Stable. Continue Dexilant 60mg .   5. Situational anxiety Stable. Diagnosis pulled for medication refill. Continue current medical treatment plan. - ALPRAZolam (XANAX) 0.5 MG tablet; Take 1 tablet (0.5 mg total) by mouth at bedtime as needed for anxiety.  Dispense: 30 tablet; Refill: 5  6. Class 2 severe obesity due to excess calories with serious comorbidity and body mass index (BMI) of 36.0 to 36.9 in adult [facility]) Counseled patient on healthy lifestyle modifications including dieting and exercise.   Return in about 4 weeks (around 06/25/2019) for HTN.      , PA-C, have reviewed all documentation for this visit. The documentation on 05/29/19 for the exam, diagnosis, procedures, and orders are all accurate and complete.    [facility] [phone] (phone) [phone] (fax)  [facility]

## 2019-05-29 ENCOUNTER — Telehealth: Payer: Self-pay

## 2019-05-29 DIAGNOSIS — M1711 Unilateral primary osteoarthritis, right knee: Secondary | ICD-10-CM

## 2019-05-29 DIAGNOSIS — E7800 Pure hypercholesterolemia, unspecified: Secondary | ICD-10-CM

## 2019-05-29 DIAGNOSIS — K219 Gastro-esophageal reflux disease without esophagitis: Secondary | ICD-10-CM

## 2019-05-29 DIAGNOSIS — I10 Essential (primary) hypertension: Secondary | ICD-10-CM

## 2019-05-29 MED ORDER — AMLODIPINE BESYLATE 10 MG PO TABS: 10.0000 mg | ORAL_TABLET | Freq: Every day | ORAL | 3 refills | Status: AC

## 2019-05-29 MED ORDER — ATORVASTATIN CALCIUM 20 MG PO TABS: 20.0000 mg | ORAL_TABLET | Freq: Every day | ORAL | 3 refills | Status: AC

## 2019-05-29 MED ORDER — DEXILANT 60 MG PO CPDR: 60.0000 mg | DELAYED_RELEASE_CAPSULE | Freq: Every day | ORAL | 3 refills | Status: DC

## 2019-05-29 MED ORDER — CELECOXIB 100 MG PO CAPS: 100.0000 mg | ORAL_CAPSULE | Freq: Two times a day (BID) | ORAL | 5 refills | Status: DC | PRN

## 2019-05-29 NOTE — Telephone Encounter (Signed)
Its because he has medicaid now.  Medications changed to [facility]

## 2019-05-29 NOTE — Telephone Encounter (Signed)
Copied from CRM [id number]. Topic: General - Other >> May 29, 2019  9:45 AM Marylen Ponto wrote: Reason for CRM: Pt stated he was not able to get his medications from [facility] so he would like to ask that the prescriptions be sent to [id number] - [place], [place] - [address] AT [facility] OF [address] [facility]

## 2019-05-29 NOTE — Telephone Encounter (Signed)
Called to see why he couldn't get the medicines from [facility] but No answer and unable to [facility] voicemail if full.

## 2019-06-08 MED ORDER — PANTOPRAZOLE SODIUM 40 MG PO TBEC: 40.0000 mg | DELAYED_RELEASE_TABLET | Freq: Two times a day (BID) | ORAL | 3 refills | Status: AC

## 2019-06-08 NOTE — Telephone Encounter (Signed)
Changed to pantoprazole (protonix) 40mg  twice daily

## 2019-06-08 NOTE — Telephone Encounter (Signed)
Pt states Medicaid will not pay for the dexlansoprazole (DEXILANT) 60 MG capsule  He was able to get everything else. Pt states he used to take omeprazole , but he needs 60 mg of this to help with his gerd.  Pt states he really needs something else, because he severe gerd w/out medication.  [facility][id number] [place], [place] - [address] AT [facility] OF [address] [facility] Phone:  [phone]  Fax:  [phone]

## 2019-06-08 NOTE — Telephone Encounter (Signed)
Tried calling; pt's mailbox is full.  PEC please advise pt as below.   Thanks,   -Toddrick Sanna  

## 2019-06-08 NOTE — Addendum Note (Signed)
Addended by: Margaretann Loveless on: 06/08/2019 04:54 PM   Modules accepted: Orders

## 2019-06-09 NOTE — Telephone Encounter (Signed)
Pt is aware new rx for protonix called into his pharmacy. Nothing further needed.

## 2019-07-13 ENCOUNTER — Ambulatory Visit: Payer: Self-pay | Admitting: Physician Assistant

## 2019-07-30 ENCOUNTER — Telehealth: Payer: Self-pay | Admitting: Physician Assistant

## 2019-07-30 DIAGNOSIS — F418 Other specified anxiety disorders: Secondary | ICD-10-CM

## 2019-07-30 MED ORDER — ALPRAZOLAM 0.5 MG PO TABS: 0.5000 mg | ORAL_TABLET | Freq: Every evening | ORAL | 0 refills | Status: AC | PRN

## 2019-07-30 NOTE — Telephone Encounter (Signed)
Copied from CRM [id number]. Topic: Quick Communication - Rx Refill/Question >> Jul 30, 2019 10:39 AM Jaquita Rector A wrote: Medication: ALPRAZolam Prudy Feeler) 0.5 MG tablet   Has the patient contacted their pharmacy? Yes.   (Agent: If no, request that the patient contact the pharmacy for the refill.) (Agent: If yes, when and what did the pharmacy advise?)  Preferred Pharmacy (with phone number or street name): [facility] #[id number] - [place], [place] - [address] [place] AT [facility] OF [address] [place]  Phone:  [phone] Fax:  [phone]     Agent: Please be advised that RX refills may take up to 3 business days. We ask that you follow-up with your pharmacy.

## 2019-07-30 NOTE — Telephone Encounter (Signed)
Patient states he moved to [place] last weekend. He is requesting a RX until he can switch PCPs

## 2019-07-30 NOTE — Telephone Encounter (Signed)
Why is he requesting at a different pharmacy?  Refills are on file at [facility]

## 2019-07-30 NOTE — Addendum Note (Signed)
Addended by: Margaretann Loveless on: 07/30/2019 04:04 PM   Modules accepted: Orders

## 2019-07-30 NOTE — Telephone Encounter (Signed)
Ok I wish him the best. I cannot prescribe more than one month in a different state for controlled substances

## 2019-07-30 NOTE — Telephone Encounter (Signed)
It looks like he should have several refills.  Do you agree?

## 2019-07-31 NOTE — Telephone Encounter (Signed)
Pt is calling checking on the status of refill alprazolam

## 2019-07-31 NOTE — Telephone Encounter (Signed)
Patient advised as directed by the provider and that it was sent in yesterday.

## 2020-01-09 IMAGING — CR DG LUMBAR SPINE 2-3V
1 series · 3 of 3 positions shown · non-contrast
Comparison: None.

CLINICAL DATA: Low back pain after fall last week.

EXAM:
LUMBAR SPINE - 2-3 VIEW

[Series 1: dg lumbar spine 2-3 views · 0.14mm/px · 3 of 3 slices shown]
[im 1/3]
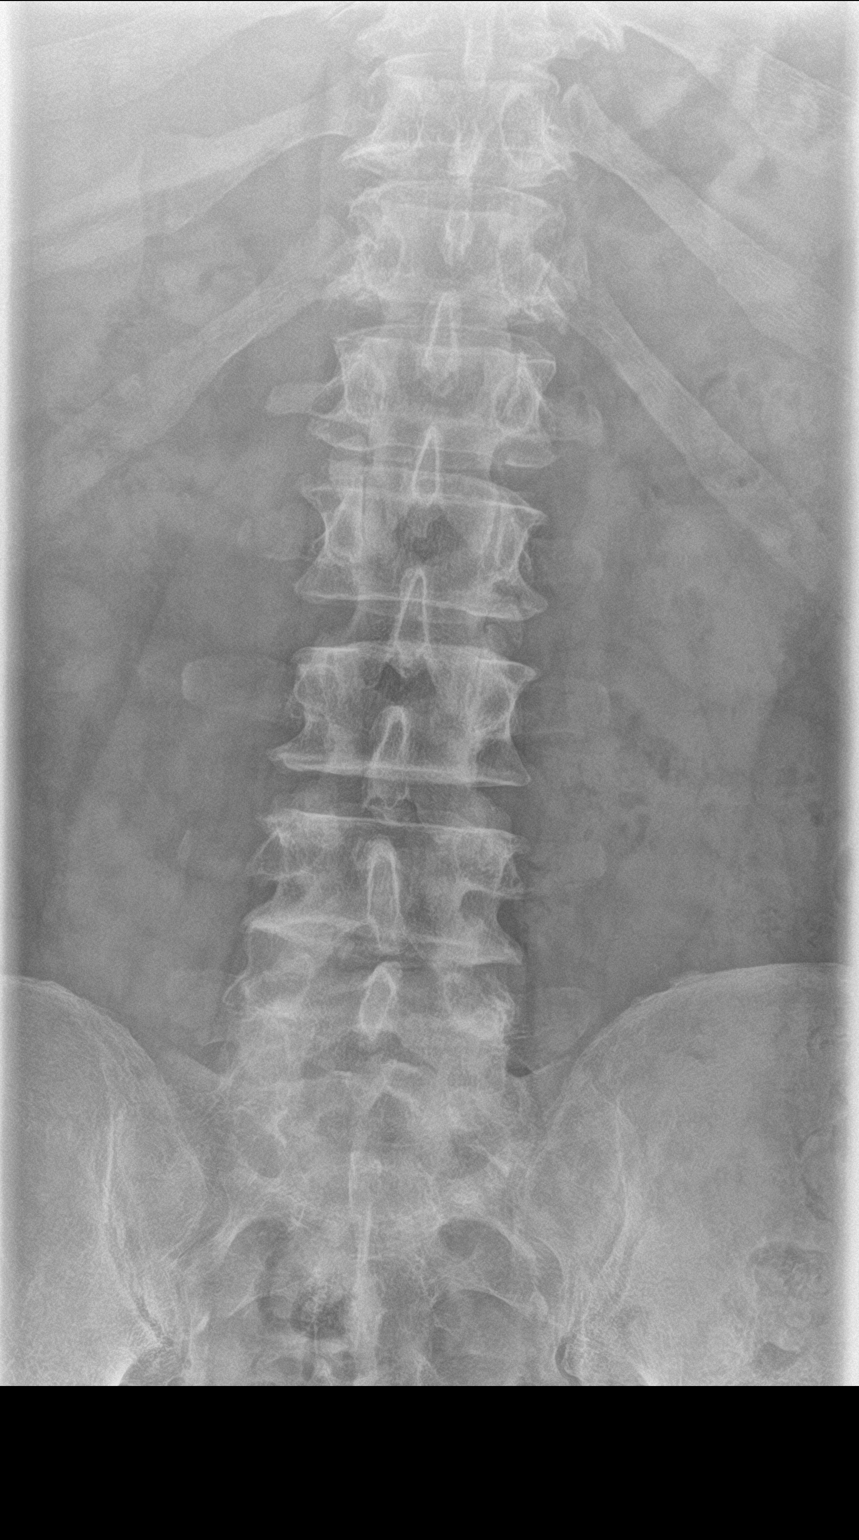
[im 2/3]
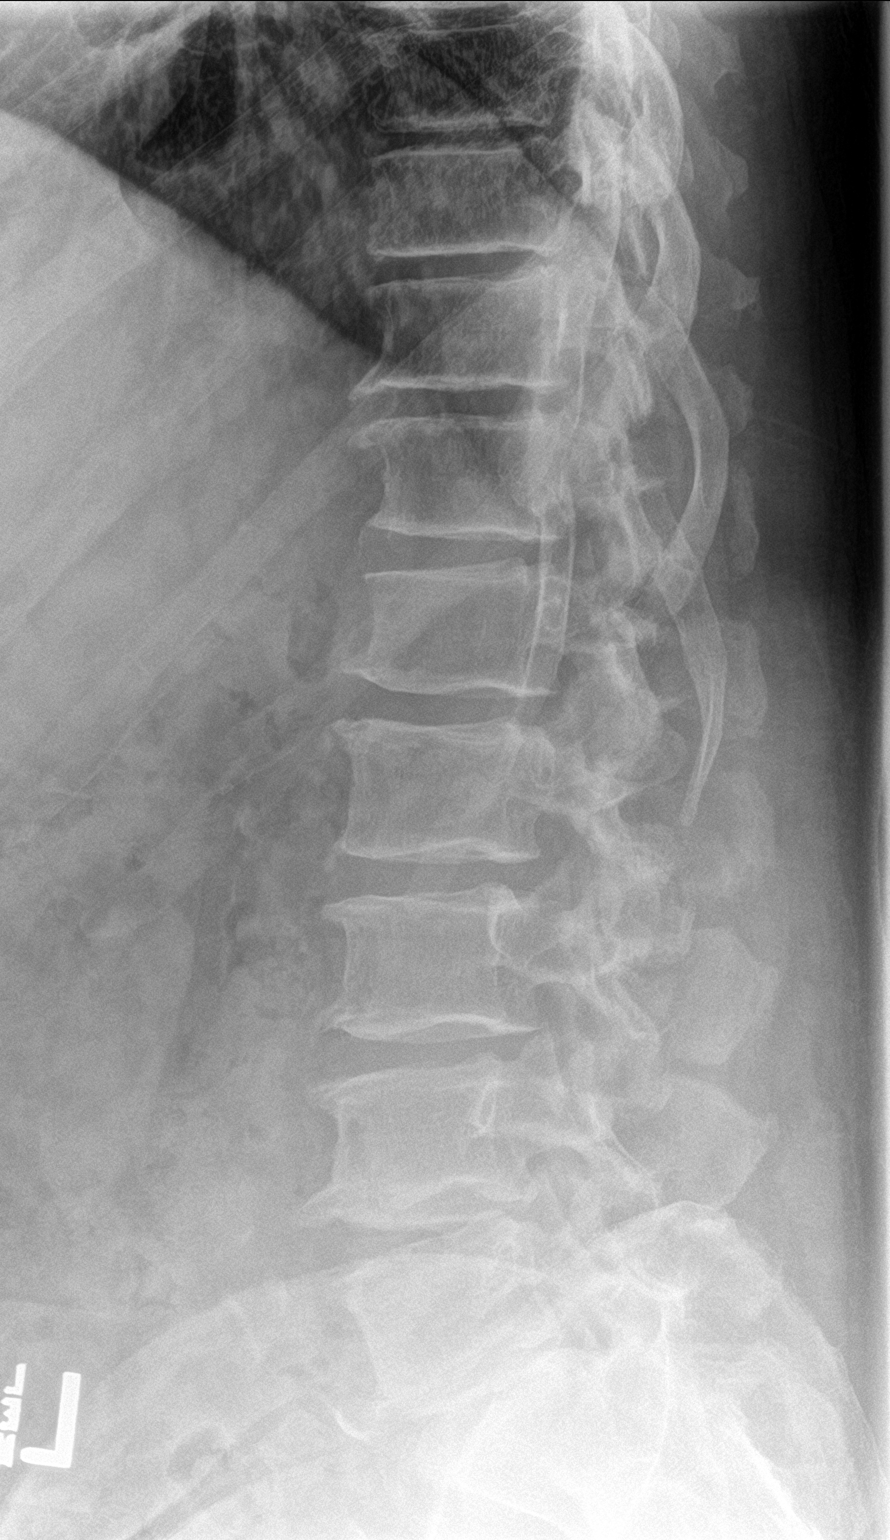
[im 3/3]
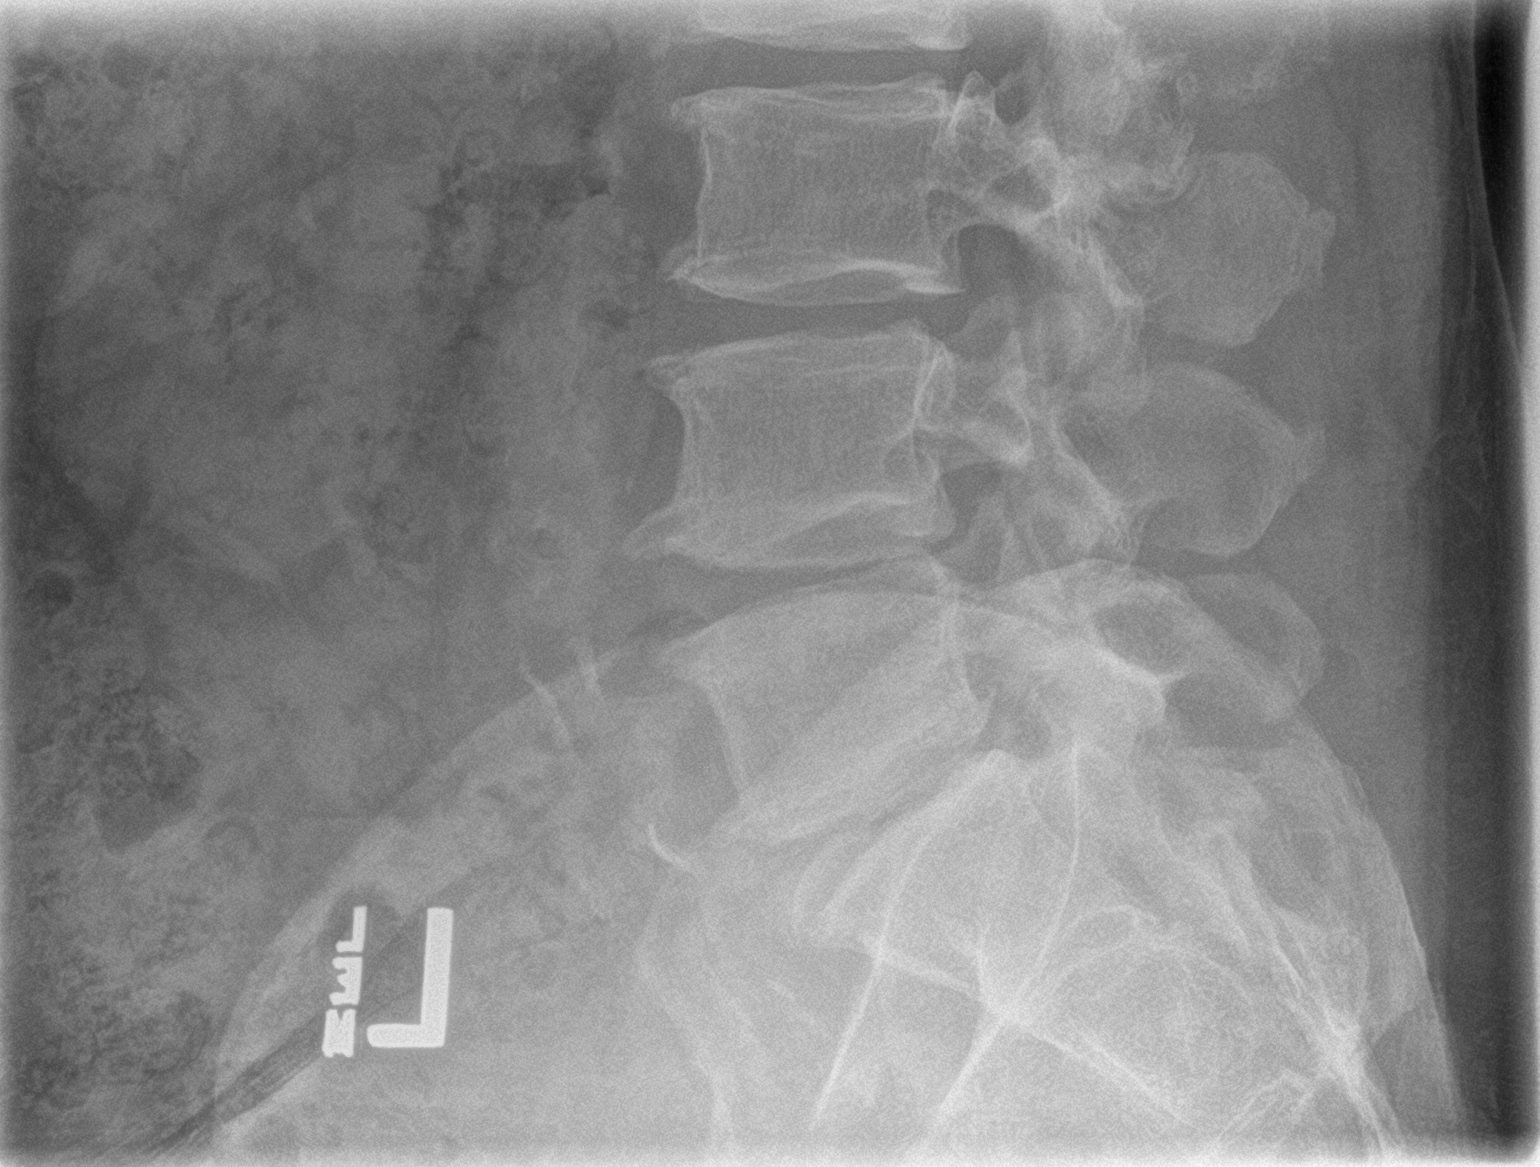

[3 of 3 positions shown; findings below may reference images not displayed]

FINDINGS: No fracture or spondylolisthesis is noted. Mild degenerative disc
disease is noted at L3-4 and L4-5 with anterior osteophyte
formation. Anterior osteophyte formation is also noted at L1-2.
IMPRESSION: Mild multilevel degenerative changes are noted. No acute abnormality
seen in the lumbar spine.

## 2020-02-29 ENCOUNTER — Other Ambulatory Visit: Payer: Self-pay

## 2020-02-29 DIAGNOSIS — M1711 Unilateral primary osteoarthritis, right knee: Secondary | ICD-10-CM

## 2020-02-29 MED ORDER — CELECOXIB 100 MG PO CAPS: 100.0000 mg | ORAL_CAPSULE | Freq: Two times a day (BID) | ORAL | 3 refills | Status: AC | PRN

## 2020-02-29 NOTE — Telephone Encounter (Signed)
[facility] faxed refill request for the following medications:  celecoxib (CELEBREX) 100 MG capsule   Please advise.

## 2023-12-02 DEATH — deceased
